# Patient Record
Sex: Male | Born: 1965 | Race: White | Hispanic: No | Marital: Married | Smoking: Former smoker
Health system: Southern US, Community
[De-identification: ages and names within clinical notes are randomized; demographics above are authoritative.]

## PROBLEM LIST (undated history)

## (undated) DIAGNOSIS — E785 Hyperlipidemia, unspecified: Secondary | ICD-10-CM

## (undated) DIAGNOSIS — S46011A Strain of muscle(s) and tendon(s) of the rotator cuff of right shoulder, initial encounter: Secondary | ICD-10-CM

## (undated) DIAGNOSIS — E119 Type 2 diabetes mellitus without complications: Secondary | ICD-10-CM

## (undated) DIAGNOSIS — G4730 Sleep apnea, unspecified: Secondary | ICD-10-CM

## (undated) HISTORY — DX: Hyperlipidemia, unspecified: E78.5

## (undated) HISTORY — PX: OTHER SURGICAL HISTORY: SHX169

## (undated) HISTORY — DX: Type 2 diabetes mellitus without complications: E11.9

---
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
## 2001-02-08 ENCOUNTER — Encounter: Payer: Self-pay | Admitting: Internal Medicine

## 2001-02-08 ENCOUNTER — Ambulatory Visit (HOSPITAL_COMMUNITY): Admission: RE | Admit: 2001-02-08 | Discharge: 2001-02-08 | Payer: Self-pay | Admitting: Internal Medicine

## 2003-12-21 ENCOUNTER — Other Ambulatory Visit: Admission: RE | Admit: 2003-12-21 | Discharge: 2003-12-21 | Payer: Self-pay | Admitting: Dermatology

## 2006-04-17 ENCOUNTER — Ambulatory Visit (HOSPITAL_COMMUNITY): Admission: RE | Admit: 2006-04-17 | Discharge: 2006-04-17 | Payer: Self-pay | Admitting: Family Medicine

## 2006-08-17 ENCOUNTER — Emergency Department (HOSPITAL_COMMUNITY): Admission: EM | Admit: 2006-08-17 | Discharge: 2006-08-17 | Payer: Self-pay | Admitting: Emergency Medicine

## 2007-08-07 ENCOUNTER — Ambulatory Visit (HOSPITAL_COMMUNITY): Admission: RE | Admit: 2007-08-07 | Discharge: 2007-08-07 | Payer: Self-pay | Admitting: Family Medicine

## 2008-04-22 ENCOUNTER — Ambulatory Visit (HOSPITAL_BASED_OUTPATIENT_CLINIC_OR_DEPARTMENT_OTHER): Admission: RE | Admit: 2008-04-22 | Discharge: 2008-04-22 | Payer: Self-pay | Admitting: Orthopedic Surgery

## 2008-05-26 ENCOUNTER — Encounter (HOSPITAL_COMMUNITY): Admission: RE | Admit: 2008-05-26 | Discharge: 2008-06-25 | Payer: Self-pay | Admitting: Orthopedic Surgery

## 2010-05-19 LAB — POCT I-STAT, CHEM 8
BUN: 13 mg/dL (ref 6–23)
Calcium, Ion: 1.22 mmol/L (ref 1.12–1.32)
Chloride: 106 mEq/L (ref 96–112)
Creatinine, Ser: 0.8 mg/dL (ref 0.4–1.5)
Glucose, Bld: 148 mg/dL — ABNORMAL HIGH (ref 70–99)
HCT: 49 % (ref 39.0–52.0)
Hemoglobin: 16.7 g/dL (ref 13.0–17.0)
Potassium: 3.7 mEq/L (ref 3.5–5.1)
Sodium: 143 mEq/L (ref 135–145)
TCO2: 26 mmol/L (ref 0–100)

## 2010-05-19 LAB — GLUCOSE, CAPILLARY: Glucose-Capillary: 138 mg/dL — ABNORMAL HIGH (ref 70–99)

## 2010-06-21 NOTE — Op Note (Signed)
NAME:  Preston Mack, Preston Mack NO.:  [id number]   MEDICAL RECORD NO.:  [id number]          PATIENT TYPE:  AMB   LOCATION:  DSC                          FACILITY:  [facility]   PHYSICIAN:  Robert A. Thurston Hole, M.D. DATE OF BIRTH:  16-Jun-1965   DATE OF PROCEDURE:  04/22/2008  DATE OF DISCHARGE:                               OPERATIVE REPORT   PREOPERATIVE DIAGNOSES:  1. Left triceps tendinitis with partial tear and olecranon spur.  2. Left elbow olecranon bursitis.   POSTOPERATIVE DIAGNOSES:  1. Left triceps tendinitis with partial tear and olecranon spur.  2. Left elbow olecranon bursitis.   PROCEDURES:  1. Left elbow examination under anesthesia followed by triceps      debridement with olecranon spur excision.  2. Left elbow olecranon bursectomy.   SURGEON:  Nilda Simmer, MD   ASSISTANT:  Julien Girt, PA   ANESTHESIA:  General.   OPERATIVE TIME:  45 minutes.   COMPLICATIONS:  None.   INDICATIONS FOR PROCEDURE:  Mr. Crisci is a 45 year old gentleman who has  had significant pain in his left elbow for over a year with exam, x-  rays, and MRI documenting a triceps tendinopathy, partial tearing and  olecranon spur as well as olecranon bursitis.  He has failed  conservative care and is now to undergo excision.   DESCRIPTION:  Mr. Rubi was brought to the operating room on April 22, 2008, after an axillary block was placed on him by Anesthesia.  He was  placed on the operative table in the supine position.  He received Ancef  1 g IV preoperatively for prophylaxis.  After being placed under general  anesthesia, his left elbow was examined.  He had full range of motion  and his elbow was stable to ligamentous exam and had left arm prepped  using sterile DuraPrep and draped using sterile technique.  The arm was  exsanguinated and the tourniquet elevated to 275 mm.  Initially, through  a 4-5 cm longitudinal incision based over the distal triceps tendon and  olecranon, initial exposure was made.  The underlying subcutaneous  tissues were incised along with the skin incision.  Olecranon bursal  tissue was found and thoroughly excised.  There was a small cystic  hematoma-type mass in this that was completely benign in appearance,  this was resected as well.  The underlying triceps tendon showed a  moderate tendinopathy and a longitudinal split was made in the  longitudinal tendon.  Ulnar nerve carefully protected during this entire  procedure.   Using intraoperative fluoroscopy, the olecranon spur was found and was  resected at the tip of the olecranon through the triceps tendon split.  Tendinopathy in the triceps tendon was then debrided, but only about 15-  20% of the triceps tendon was partially detached from the olecranon with  this debridement.  The intraoperative fluoroscopy confirmed complete  excision of the calcified spur.  At this point, the wound was irrigated  and then the triceps tendon closed, primarily tendon-to-tendon with  interrupted 2-0 Vicryl suture.  Subcutaneous tissue was closed with 2-0  Vicryl,  and subcuticular layer closed with 4-0 Monocryl.  Sterile  dressings and a long-arm splint were applied.  The patient then had the  tourniquet released.  He was then awakened, extubated, and taken to the  recovery room in stable condition.  Needle and sponge count was correct  x2 at the end of the case.   FOLLOWUP CARE:  Mr. Durrell will be followed either overnight for  observation due to his sleep apnea or discharged home depending on his  level of postoperative oxygenation.  He will be discharged on Percocet  and Robaxin.  Seen back in the office in a week for wound check and  followup.      Robert A. Thurston Hole, M.D.  Electronically Signed     RAW/MEDQ  D:  04/22/2008  T:  04/23/2008  Job:  [id number]

## 2010-11-22 LAB — BASIC METABOLIC PANEL
BUN: 12
CO2: 26
Calcium: 9
Chloride: 99
Creatinine, Ser: 0.53
GFR calc Af Amer: >60
GFR calc non Af Amer: >60
Glucose, Bld: 278 — ABNORMAL HIGH
Potassium: 3.8
Sodium: 133 — ABNORMAL LOW

## 2014-04-17 ENCOUNTER — Ambulatory Visit: Payer: Self-pay | Admitting: Skilled Nursing Facility1

## 2014-05-09 LAB — HM DIABETES EYE EXAM

## 2014-11-20 ENCOUNTER — Ambulatory Visit: Payer: Self-pay | Admitting: "Endocrinology

## 2014-11-27 LAB — HEMOGLOBIN A1C: Hgb A1c MFr Bld: 6.6 % — AB (ref 4.0–6.0)

## 2014-12-09 ENCOUNTER — Ambulatory Visit (INDEPENDENT_AMBULATORY_CARE_PROVIDER_SITE_OTHER): Payer: BC Managed Care – PPO | Admitting: "Endocrinology

## 2014-12-09 ENCOUNTER — Encounter: Payer: Self-pay | Admitting: "Endocrinology

## 2014-12-09 VITALS — BP 117/77 | HR 70 | Ht 61.0 in | Wt 262.0 lb

## 2014-12-09 DIAGNOSIS — IMO0002 Reserved for concepts with insufficient information to code with codable children: Secondary | ICD-10-CM

## 2014-12-09 DIAGNOSIS — E785 Hyperlipidemia, unspecified: Secondary | ICD-10-CM

## 2014-12-09 DIAGNOSIS — E6609 Other obesity due to excess calories: Secondary | ICD-10-CM

## 2014-12-09 DIAGNOSIS — E119 Type 2 diabetes mellitus without complications: Secondary | ICD-10-CM | POA: Insufficient documentation

## 2014-12-09 DIAGNOSIS — I10 Essential (primary) hypertension: Secondary | ICD-10-CM

## 2014-12-09 DIAGNOSIS — Z6834 Body mass index (BMI) 34.0-34.9, adult: Secondary | ICD-10-CM

## 2014-12-09 DIAGNOSIS — E782 Mixed hyperlipidemia: Secondary | ICD-10-CM | POA: Insufficient documentation

## 2014-12-09 DIAGNOSIS — E118 Type 2 diabetes mellitus with unspecified complications: Secondary | ICD-10-CM | POA: Diagnosis not present

## 2014-12-09 DIAGNOSIS — E1165 Type 2 diabetes mellitus with hyperglycemia: Secondary | ICD-10-CM

## 2014-12-09 MED ORDER — SIMVASTATIN 40 MG PO TABS: 40.0000 mg | ORAL_TABLET | Freq: Every day | ORAL | Status: DC

## 2014-12-09 MED ORDER — LISINOPRIL 20 MG PO TABS: 20.0000 mg | ORAL_TABLET | Freq: Every day | ORAL | Status: DC

## 2014-12-09 MED ORDER — SITAGLIPTIN PHOS-METFORMIN HCL 50-1000 MG PO TABS: 1.0000 | ORAL_TABLET | Freq: Two times a day (BID) | ORAL | Status: DC

## 2014-12-09 NOTE — Patient Instructions (Signed)

## 2014-12-09 NOTE — Progress Notes (Signed)
Subjective:    Patient ID: Preston Mack, male    DOB: 1965/05/21,    Past Medical History  Diagnosis Date  . Hyperlipidemia   . Diabetes mellitus, type II [facility])    Past Surgical History  Procedure Laterality Date  . Lipoma removal    . Osteophyte of bone     Social History   Social History  . Marital Status: Married    Spouse Name: N/A  . Number of Children: N/A  . Years of Education: N/A   Social History Main Topics  . Smoking status: Former Games developer  . Smokeless tobacco: None  . Alcohol Use: No  . Drug Use: No  . Sexual Activity: Not Asked   Other Topics Concern  . None   Social History Narrative  . None   Outpatient Encounter Prescriptions as of 12/09/2014  Medication Sig  . lisinopril (PRINIVIL,ZESTRIL) 20 MG tablet Take 1 tablet (20 mg total) by mouth daily.  . simvastatin (ZOCOR) 40 MG tablet Take 1 tablet (40 mg total) by mouth daily.  . sitaGLIPtin-metformin (JANUMET) 50-1000 MG tablet Take 1 tablet by mouth 2 (two) times daily with a meal.  . [DISCONTINUED] canagliflozin (INVOKANA) 100 MG TABS tablet Take 100 mg by mouth daily.  . [DISCONTINUED] lisinopril (PRINIVIL,ZESTRIL) 20 MG tablet Take 20 mg by mouth daily.  . [DISCONTINUED] simvastatin (ZOCOR) 40 MG tablet Take 40 mg by mouth daily.  . [DISCONTINUED] sitaGLIPtin-metformin (JANUMET) 50-1000 MG tablet Take 1 tablet by mouth 2 (two) times daily with a meal.   No facility-administered encounter medications on file as of 12/09/2014.   ALLERGIES: No Known Allergies VACCINATION STATUS:  There is no immunization history on file for this patient.  Diabetes He presents for his follow-up diabetic visit. He has type 2 diabetes mellitus. Onset time: He was diagnosed at approximate age of 37 years. His disease course has been improving. There are no hypoglycemic associated symptoms. Pertinent negatives for hypoglycemia include no confusion, headaches, pallor or seizures. There are no diabetic associated  symptoms. Pertinent negatives for diabetes include no chest pain, no fatigue, no polydipsia, no polyphagia, no polyuria and no weakness. There are no hypoglycemic complications. Symptoms are improving. There are no diabetic complications. Risk factors for coronary artery disease include diabetes mellitus, dyslipidemia, hypertension, family history, obesity, sedentary lifestyle and tobacco exposure. Current diabetic treatment includes oral agent (dual therapy). He is compliant with treatment most of the time. His weight is decreasing steadily. He is following a diabetic diet. He has had a previous visit with a dietitian. He participates in exercise intermittently. An ACE inhibitor/angiotensin II receptor blocker is being taken. Eye exam is current.  Hyperlipidemia This is a chronic problem. The current episode started more than 1 year ago. The problem is controlled. Exacerbating diseases include diabetes. Pertinent negatives include no chest pain, myalgias or shortness of breath. Current antihyperlipidemic treatment includes statins. Risk factors for coronary artery disease include diabetes mellitus, dyslipidemia, hypertension, male sex, a sedentary lifestyle and obesity.  Hypertension This is a chronic problem. The current episode started more than 1 year ago. Pertinent negatives include no chest pain, headaches, neck pain, palpitations or shortness of breath. Risk factors for coronary artery disease include dyslipidemia, diabetes mellitus and obesity. Past treatments include ACE inhibitors.     Review of Systems  Constitutional: Negative for fatigue and unexpected weight change.  HENT: Negative for dental problem, mouth sores and trouble swallowing.   Eyes: Negative for visual disturbance.  Respiratory: Negative for  cough, choking, chest tightness, shortness of breath and wheezing.   Cardiovascular: Negative for chest pain, palpitations and leg swelling.  Gastrointestinal: Negative for nausea,  vomiting, abdominal pain, diarrhea, constipation and abdominal distention.  Endocrine: Negative for polydipsia, polyphagia and polyuria.  Genitourinary: Negative for dysuria, urgency, hematuria and flank pain.  Musculoskeletal: Negative for myalgias, back pain, gait problem and neck pain.  Skin: Negative for pallor, rash and wound.  Neurological: Negative for seizures, syncope, weakness, numbness and headaches.  Psychiatric/Behavioral: Negative.  Negative for confusion and dysphoric mood.    Objective:    BP 117/77 mmHg  Pulse 70  Ht 5\' 1"  (1.549 m)  Wt 262 lb (118.842 kg)  BMI 49.53 kg/m2  SpO2 96%  Wt Readings from Last 3 Encounters:  12/09/14 262 lb (118.842 kg)    Physical Exam  Constitutional: He is oriented to person, place, and time. He appears well-developed and well-nourished. He is cooperative. No distress.  HENT:  Head: Normocephalic and atraumatic.  Eyes: EOM are normal.  Neck: Normal range of motion. Neck supple. No tracheal deviation present. No thyromegaly present.  Cardiovascular: Normal rate, S1 normal, S2 normal and normal heart sounds.  Exam reveals no gallop.   No murmur heard. Pulses:      Dorsalis pedis pulses are 1+ on the right side, and 1+ on the left side.       Posterior tibial pulses are 1+ on the right side, and 1+ on the left side.  Pulmonary/Chest: Breath sounds normal. No respiratory distress. He has no wheezes.  Abdominal: Soft. Bowel sounds are normal. He exhibits no distension. There is no tenderness. There is no guarding and no CVA tenderness.  Musculoskeletal: He exhibits no edema.       Right shoulder: He exhibits no swelling and no deformity.  Neurological: He is alert and oriented to person, place, and time. He has normal strength and normal reflexes. No cranial nerve deficit or sensory deficit. Gait normal.  Skin: Skin is warm and dry. No rash noted. No cyanosis. Nails show no clubbing.  Psychiatric: He has a normal mood and affect. His  speech is normal and behavior is normal. Judgment and thought content normal. Cognition and memory are normal.    Results for orders placed or performed in visit on 12/09/14  Hemoglobin A1c  Result Value Ref Range   Hgb A1c MFr Bld 6.6 (A) 4.0 - 6.0 %  HM DIABETES EYE EXAM  Result Value Ref Range   HM Diabetic Eye Exam No Retinopathy No Retinopathy   Complete Blood Count (Most recent): Lab Results  Component Value Date   HGB 16.7 04/22/2008   HCT 49.0 04/22/2008   Chemistry (most recent): Lab Results  Component Value Date   NA 143 04/22/2008   K 3.7 04/22/2008   CL 106 04/22/2008   CO2 26 08/17/2006   BUN 13 04/22/2008   CREATININE 0.8 04/22/2008   Diabetic Labs (most recent): Lab Results  Component Value Date   HGBA1C 6.6* 11/27/2014   Lipid profile (most recent): No results found for: TRIG, CHOL       Assessment & Plan:   1. Uncontrolled type 2 diabetes mellitus with complication, without long-term current use of insulin (HCC) - patient remains at a high risk for more acute and chronic complications of diabetes which include CAD, CVA, CKD, retinopathy, and neuropathy. These are all discussed in detail with the patient.  Patient came with better A1c of 6.6%, generally improving from 10.5%. He has lost  30 pounds overall.  Recent labs reviewed.   - I have re-counseled the patient on diet management and weight loss  by adopting a carbohydrate restricted / protein rich  Diet.  - Suggestion is made for patient to avoid simple carbohydrates   from their diet including Cakes , Desserts, Ice Cream,  Soda (  diet and regular) , Sweet Tea , Candies,  Chips, Cookies, Artificial Sweeteners,   and "Sugar-free" Products .  This will help patient to have stable blood glucose profile and potentially avoid unintended  Weight gain.  - Patient is advised to stick to a routine mealtimes to eat 3 meals  a day and avoid unnecessary snacks ( to snack only to correct hypoglycemia).  -  The patient  has been  scheduled with Norm Salt, RDN, CDE for individualized DM education.  - I have approached patient with the following individualized plan to manage diabetes and patient agrees.  -I will continue Janumet 50/1000mg  po BID. -I will discontinue his invokana. -he will be considered for GLP 1 inhibitors if he loses control.   - Patient specific target  for A1c; LDL, HDL, Triglycerides, and  Waist Circumference were discussed in detail.  2) BP/HTN: Controlled. Continue current medications including ACEI/ARB. 3) Lipids/HPL:  continue statins. 4)  Weight/Diet: CDE consult in progress, exercise, and carbohydrates information provided.  5) Chronic Care/Health Maintenance:  -Patient is on ACEI/ARB and Statin medications and encouraged to continue to follow up with Ophthalmology, Podiatrist at least yearly or according to recommendations, and advised to  stay away from smoking. I have recommended yearly flu vaccine and pneumonia vaccination at least every 5 years; moderate intensity exercise for up to 150 minutes weekly; and  sleep for at least 7 hours a day.  I advised patient to maintain close follow up with their PCP for primary care needs.  Patient is asked to bring meter and  blood glucose logs during their next visit.   Follow up plan: Return for diabetes, high blood pressure, high cholesterol.  Marquis Lunch, MD Phone: [phone]  Fax: [phone]   12/09/2014, 9:13 PM

## 2014-12-15 ENCOUNTER — Other Ambulatory Visit (HOSPITAL_COMMUNITY): Payer: Self-pay | Admitting: Family Medicine

## 2014-12-15 ENCOUNTER — Ambulatory Visit (HOSPITAL_COMMUNITY)
Admission: RE | Admit: 2014-12-15 | Discharge: 2014-12-15 | Disposition: A | Discharge status: Home / Self Care | Payer: BC Managed Care – PPO | Source: Ambulatory Visit | Attending: Family Medicine | Admitting: Family Medicine

## 2014-12-15 DIAGNOSIS — R600 Localized edema: Secondary | ICD-10-CM | POA: Insufficient documentation

## 2014-12-15 DIAGNOSIS — M7989 Other specified soft tissue disorders: Secondary | ICD-10-CM

## 2014-12-15 DIAGNOSIS — L03115 Cellulitis of right lower limb: Secondary | ICD-10-CM

## 2014-12-15 DIAGNOSIS — M79604 Pain in right leg: Secondary | ICD-10-CM | POA: Diagnosis not present

## 2015-02-12 ENCOUNTER — Other Ambulatory Visit: Payer: Self-pay

## 2015-02-12 MED ORDER — GLUCOSE BLOOD VI STRP: ORAL_STRIP | Status: DC

## 2015-03-12 ENCOUNTER — Ambulatory Visit: Payer: BC Managed Care – PPO | Admitting: "Endocrinology

## 2015-03-13 ENCOUNTER — Other Ambulatory Visit: Payer: Self-pay | Admitting: "Endocrinology

## 2015-03-13 LAB — BASIC METABOLIC PANEL
BUN: 20 mg/dL (ref 7–25)
CHLORIDE: 107 mmol/L (ref 98–110)
CO2: 29 mmol/L (ref 20–31)
Calcium: 9.2 mg/dL (ref 8.6–10.3)
Creat: 0.82 mg/dL (ref 0.60–1.35)
Glucose, Bld: 194 mg/dL — ABNORMAL HIGH (ref 65–99)
Potassium: 4.9 mmol/L (ref 3.5–5.3)
Sodium: 140 mmol/L (ref 135–146)

## 2015-03-13 LAB — HEMOGLOBIN A1C
HEMOGLOBIN A1C: 8.4 % — AB (ref <5.7)
MEAN PLASMA GLUCOSE: 194 mg/dL — AB (ref <117)

## 2015-03-19 ENCOUNTER — Ambulatory Visit (INDEPENDENT_AMBULATORY_CARE_PROVIDER_SITE_OTHER): Payer: BC Managed Care – PPO | Admitting: "Endocrinology

## 2015-03-19 ENCOUNTER — Encounter: Payer: Self-pay | Admitting: "Endocrinology

## 2015-03-19 VITALS — BP 114/80 | HR 76 | Ht 74.0 in | Wt 279.0 lb

## 2015-03-19 DIAGNOSIS — E6609 Other obesity due to excess calories: Secondary | ICD-10-CM

## 2015-03-19 DIAGNOSIS — E1165 Type 2 diabetes mellitus with hyperglycemia: Secondary | ICD-10-CM | POA: Diagnosis not present

## 2015-03-19 DIAGNOSIS — E118 Type 2 diabetes mellitus with unspecified complications: Secondary | ICD-10-CM | POA: Diagnosis not present

## 2015-03-19 DIAGNOSIS — E785 Hyperlipidemia, unspecified: Secondary | ICD-10-CM

## 2015-03-19 DIAGNOSIS — IMO0002 Reserved for concepts with insufficient information to code with codable children: Secondary | ICD-10-CM

## 2015-03-19 DIAGNOSIS — I10 Essential (primary) hypertension: Secondary | ICD-10-CM

## 2015-03-19 MED ORDER — CANAGLIFLOZIN 100 MG PO TABS: 100.0000 mg | ORAL_TABLET | Freq: Every day | ORAL | Status: DC

## 2015-03-19 NOTE — Progress Notes (Signed)
Subjective:    Patient ID: Preston Mack, male    DOB: 07/20/1965,    Past Medical History  Diagnosis Date  . Hyperlipidemia   . Diabetes mellitus, type II [facility])    Past Surgical History  Procedure Laterality Date  . Lipoma removal    . Osteophyte of bone     Social History   Social History  . Marital Status: Married    Spouse Name: N/A  . Number of Children: N/A  . Years of Education: N/A   Social History Main Topics  . Smoking status: Former Games developer  . Smokeless tobacco: None  . Alcohol Use: No  . Drug Use: No  . Sexual Activity: Not Asked   Other Topics Concern  . None   Social History Narrative   Outpatient Encounter Prescriptions as of 03/19/2015  Medication Sig  . canagliflozin (INVOKANA) 100 MG TABS tablet Take 1 tablet (100 mg total) by mouth daily before breakfast.  . glucose blood test strip Use as instructed tid  Dx: E11.65.  Marland Kitchen lisinopril (PRINIVIL,ZESTRIL) 20 MG tablet Take 1 tablet (20 mg total) by mouth daily.  . simvastatin (ZOCOR) 40 MG tablet Take 1 tablet (40 mg total) by mouth daily.  . sitaGLIPtin-metformin (JANUMET) 50-1000 MG tablet Take 1 tablet by mouth 2 (two) times daily with a meal.   No facility-administered encounter medications on file as of 03/19/2015.   ALLERGIES: No Known Allergies VACCINATION STATUS:  There is no immunization history on file for this patient.  Diabetes He presents for his follow-up diabetic visit. He has type 2 diabetes mellitus. Onset time: He was diagnosed at approximate age of 37 years. 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 This caused him to have a course of uncontrolled glycemia and raised is A1c to 8.4%.). There are no hypoglycemic associated symptoms. Pertinent negatives for hypoglycemia include no confusion, headaches, pallor or seizures. There are no diabetic associated symptoms.  Pertinent negatives for diabetes include no chest pain, no fatigue, no polydipsia, no polyphagia, no polyuria and no weakness. There are no hypoglycemic complications. Symptoms are worsening. There are no diabetic complications. Risk factors for coronary artery disease include diabetes mellitus, dyslipidemia, hypertension, family history, obesity, sedentary lifestyle and tobacco exposure. Current diabetic treatment includes oral agent (dual therapy). He is compliant with treatment most of the time. His weight is increasing steadily. He is following a diabetic diet. He has had a previous visit with a dietitian. He participates in exercise intermittently. His overall blood glucose range is 180-200 mg/dl. An ACE inhibitor/angiotensin II receptor blocker is being taken. Eye exam is current.  Hyperlipidemia This is a chronic problem. The current episode started more than 1 year ago. The problem is controlled. Exacerbating diseases include diabetes. Pertinent negatives include no chest pain, myalgias or shortness of breath. Current antihyperlipidemic treatment includes statins. Risk factors for coronary artery disease include diabetes mellitus, dyslipidemia, hypertension, male sex, a sedentary lifestyle and obesity.  Hypertension This is a chronic problem. The current episode started more than 1 year ago. Pertinent negatives include no chest pain, headaches, neck pain, palpitations or shortness of breath. Risk factors for coronary artery disease include dyslipidemia, diabetes mellitus and obesity. Past treatments include ACE inhibitors.     Review of Systems  Constitutional: Negative for fatigue and unexpected weight change.  HENT: Negative for dental problem, mouth sores and trouble swallowing.  Eyes: Negative for visual disturbance.  Respiratory: Negative for cough, choking, chest tightness, shortness of breath and wheezing.   Cardiovascular: Negative for chest pain, palpitations and leg swelling.   Gastrointestinal: Negative for nausea, vomiting, abdominal pain, diarrhea, constipation and abdominal distention.  Endocrine: Negative for polydipsia, polyphagia and polyuria.  Genitourinary: Negative for dysuria, urgency, hematuria and flank pain.  Musculoskeletal: Negative for myalgias, back pain, gait problem and neck pain.  Skin: Negative for pallor, rash and wound.  Neurological: Negative for seizures, syncope, weakness, numbness and headaches.  Psychiatric/Behavioral: Negative.  Negative for confusion and dysphoric mood.    Objective:    BP 114/80 mmHg  Pulse 76  Ht  (1.88 m)  Wt 279 lb (126.554 kg)  BMI 35.81 kg/m2  SpO2 97%  Wt Readings from Last 3 Encounters:  03/19/15 279 lb (126.554 kg)  12/09/14 262 lb (118.842 kg)    Physical Exam  Constitutional: He is oriented to person, place, and time. He appears well-developed and well-nourished. He is cooperative. No distress.  HENT:  Head: Normocephalic and atraumatic.  Eyes: EOM are normal.  Neck: Normal range of motion. Neck supple. No tracheal deviation present. No thyromegaly present.  Cardiovascular: Normal rate, S1 normal, S2 normal and normal heart sounds.  Exam reveals no gallop.   No murmur heard. Pulses:      Dorsalis pedis pulses are 1+ on the right side, and 1+ on the left side.       Posterior tibial pulses are 1+ on the right side, and 1+ on the left side.  Pulmonary/Chest: Breath sounds normal. No respiratory distress. He has no wheezes.  Abdominal: Soft. Bowel sounds are normal. He exhibits no distension. There is no tenderness. There is no guarding and no CVA tenderness.  Musculoskeletal: He exhibits no edema.       Right shoulder: He exhibits no swelling and no deformity.  Neurological: He is alert and oriented to person, place, and time. He has normal strength and normal reflexes. No cranial nerve deficit or sensory deficit. Gait normal.  Skin: Skin is warm and dry. No rash noted. No cyanosis. Nails  show no clubbing.  Psychiatric: He has a normal mood and affect. His speech is normal and behavior is normal. Judgment and thought content normal. Cognition and memory are normal.    Results for orders placed or performed in visit on 03/13/15  Basic metabolic panel  Result Value Ref Range   Sodium 140 135 - 146 mmol/L   Potassium 4.9 3.5 - 5.3 mmol/L   Chloride 107 98 - 110 mmol/L   CO2 29 20 - 31 mmol/L   Glucose, Bld 194 (H) 65 - 99 mg/dL   BUN 20 7 - 25 mg/dL   Creat 4.09 8.11 - 9.14 mg/dL   Calcium 9.2 8.6 - 78.2 mg/dL  Hemoglobin N5A  Result Value Ref Range   Hgb A1c MFr Bld 8.4 (H) <5.7 %   Mean Plasma Glucose 194 (H) <117 mg/dL   Complete Blood Count (Most recent): Lab Results  Component Value Date   HGB 16.7 04/22/2008   HCT 49.0 04/22/2008   Chemistry (most recent): Lab Results  Component Value Date   NA 140 03/13/2015   K 4.9 03/13/2015   CL 107 03/13/2015   CO2 29 03/13/2015   BUN 20 03/13/2015   CREATININE 0.82 03/13/2015   Diabetic Labs (most recent): Lab Results  Component Value Date   HGBA1C 8.4* 03/13/2015   HGBA1C 6.6* 11/27/2014  Assessment & Plan:   1. Uncontrolled type 2 diabetes mellitus with complication, without long-term current use of insulin (HCC) - patient remains at a high risk for more acute and chronic complications of diabetes which include CAD, CVA, CKD, retinopathy, and neuropathy. These are all discussed in detail with the patient.  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   Recent labs reviewed.   - I have re-counseled the patient on diet management and weight loss  by adopting a carbohydrate restricted / protein rich  Diet.  - Suggestion is made for patient to avoid simple carbohydrates   from their diet including Cakes , Desserts, Ice Cream,  Soda (  diet and regular) , Sweet Tea , Candies,  Chips, Cookies, Artificial  Sweeteners,   and "Sugar-free" Products .  This will help patient to have stable blood glucose profile and potentially avoid unintended  Weight gain.  - Patient is advised to stick to a routine mealtimes to eat 3 meals  a day and avoid unnecessary snacks ( to snack only to correct hypoglycemia).  - The patient  has been  scheduled with Norm Salt, RDN, CDE for individualized DM education.  - I have approached patient with the following individualized plan to manage diabetes and patient agrees.  -I will continue Janumet 50/1000mg  po BID. -I will resume Invega, 100 mg by mouth every morning. -he will be considered for GLP 1 inhibitors if he loses control.   - Patient specific target  for A1c; LDL, HDL, Triglycerides, and  Waist Circumference were discussed in detail.  2) BP/HTN: Controlled. Continue current medications including ACEI/ARB. 3) Lipids/HPL:  continue statins. 4)  Weight/Diet: CDE consult in progress, exercise, and carbohydrates information provided.  5) Chronic Care/Health Maintenance:  -Patient is on ACEI/ARB and Statin medications and encouraged to continue to follow up with Ophthalmology, Podiatrist at least yearly or according to recommendations, and advised to  stay away from smoking. I have recommended yearly flu vaccine and pneumonia vaccination at least every 5 years; moderate intensity exercise for up to 150 minutes weekly; and  sleep for at least 7 hours a day.  I advised patient to maintain close follow up with their PCP for primary care needs.  Patient is asked to bring meter and  blood glucose logs during their next visit.   Follow up plan: Return in about 3 months (around 06/16/2015) for diabetes, high blood pressure, high cholesterol, follow up with meter and logs- no labs.  Marquis Lunch, MD Phone: [phone]  Fax: [phone]   03/19/2015, 4:10 PM

## 2015-03-19 NOTE — Patient Instructions (Signed)

## 2015-04-16 ENCOUNTER — Telehealth: Payer: Self-pay | Admitting: "Endocrinology

## 2015-04-16 NOTE — Telephone Encounter (Signed)
[employer] won't pay for his invocana - please call in a new prescription

## 2015-04-19 ENCOUNTER — Other Ambulatory Visit: Payer: Self-pay | Admitting: "Endocrinology

## 2015-04-19 MED ORDER — EMPAGLIFLOZIN 10 MG PO TABS: 10.0000 mg | ORAL_TABLET | Freq: Every day | ORAL | Status: DC

## 2015-04-19 NOTE — Telephone Encounter (Signed)
I will send in Jardiance for him.

## 2015-06-19 ENCOUNTER — Other Ambulatory Visit: Payer: Self-pay | Admitting: "Endocrinology

## 2015-06-19 LAB — HEMOGLOBIN A1C
Hgb A1c MFr Bld: 7.1 % — ABNORMAL HIGH (ref <5.7)
Mean Plasma Glucose: 157 mg/dL

## 2015-06-19 LAB — BASIC METABOLIC PANEL
BUN: 20 mg/dL (ref 7–25)
CHLORIDE: 106 mmol/L (ref 98–110)
CO2: 26 mmol/L (ref 20–31)
CREATININE: 0.81 mg/dL (ref 0.60–1.35)
Calcium: 9.4 mg/dL (ref 8.6–10.3)
GLUCOSE: 132 mg/dL — AB (ref 65–99)
POTASSIUM: 4.2 mmol/L (ref 3.5–5.3)
Sodium: 140 mmol/L (ref 135–146)

## 2015-06-19 LAB — T4, FREE: FREE T4: 1.2 ng/dL (ref 0.8–1.8)

## 2015-06-19 LAB — TSH: TSH: 0.63 mIU/L (ref 0.40–4.50)

## 2015-06-21 ENCOUNTER — Ambulatory Visit: Payer: BC Managed Care – PPO | Admitting: "Endocrinology

## 2015-06-28 ENCOUNTER — Ambulatory Visit (INDEPENDENT_AMBULATORY_CARE_PROVIDER_SITE_OTHER): Payer: BC Managed Care – PPO | Admitting: "Endocrinology

## 2015-06-28 ENCOUNTER — Encounter: Payer: Self-pay | Admitting: "Endocrinology

## 2015-06-28 VITALS — BP 119/64 | HR 65 | Ht 74.0 in | Wt 278.0 lb

## 2015-06-28 DIAGNOSIS — E1165 Type 2 diabetes mellitus with hyperglycemia: Secondary | ICD-10-CM

## 2015-06-28 DIAGNOSIS — E6609 Other obesity due to excess calories: Secondary | ICD-10-CM | POA: Diagnosis not present

## 2015-06-28 DIAGNOSIS — E785 Hyperlipidemia, unspecified: Secondary | ICD-10-CM | POA: Diagnosis not present

## 2015-06-28 DIAGNOSIS — E118 Type 2 diabetes mellitus with unspecified complications: Secondary | ICD-10-CM

## 2015-06-28 DIAGNOSIS — IMO0002 Reserved for concepts with insufficient information to code with codable children: Secondary | ICD-10-CM

## 2015-06-28 DIAGNOSIS — I10 Essential (primary) hypertension: Secondary | ICD-10-CM | POA: Diagnosis not present

## 2015-06-28 MED ORDER — SITAGLIPTIN PHOS-METFORMIN HCL 50-1000 MG PO TABS: 1.0000 | ORAL_TABLET | Freq: Two times a day (BID) | ORAL | Status: DC

## 2015-06-28 MED ORDER — EMPAGLIFLOZIN 10 MG PO TABS: 10.0000 mg | ORAL_TABLET | Freq: Every day | ORAL | Status: DC

## 2015-06-28 NOTE — Progress Notes (Signed)
Subjective:    Patient ID: Preston Mack, male    DOB: 01/08/1966,    Past Medical History  Diagnosis Date  . Hyperlipidemia   . Diabetes mellitus, type II [facility])    Past Surgical History  Procedure Laterality Date  . Lipoma removal    . Osteophyte of bone     Social History   Social History  . Marital Status: Married    Spouse Name: N/A  . Number of Children: N/A  . Years of Education: N/A   Social History Main Topics  . Smoking status: Former Games developer  . Smokeless tobacco: None  . Alcohol Use: No  . Drug Use: No  . Sexual Activity: Not Asked   Other Topics Concern  . None   Social History Narrative   Outpatient Encounter Prescriptions as of 06/28/2015  Medication Sig  . empagliflozin (JARDIANCE) 10 MG TABS tablet Take 10 mg by mouth daily.  Marland Kitchen glucose blood test strip Use as instructed tid  Dx: E11.65.  Marland Kitchen lisinopril (PRINIVIL,ZESTRIL) 20 MG tablet Take 1 tablet (20 mg total) by mouth daily.  . simvastatin (ZOCOR) 40 MG tablet Take 1 tablet (40 mg total) by mouth daily.  . sitaGLIPtin-metformin (JANUMET) 50-1000 MG tablet Take 1 tablet by mouth 2 (two) times daily with a meal.  . [DISCONTINUED] empagliflozin (JARDIANCE) 10 MG TABS tablet Take 10 mg by mouth daily.  . [DISCONTINUED] sitaGLIPtin-metformin (JANUMET) 50-1000 MG tablet Take 1 tablet by mouth 2 (two) times daily with a meal.   No facility-administered encounter medications on file as of 06/28/2015.   ALLERGIES: No Known Allergies VACCINATION STATUS:  There is no immunization history on file for this patient.  Diabetes He presents for his follow-up diabetic visit. He has type 2 diabetes mellitus. Onset time: He was diagnosed at approximate age of 37 years. His disease course has been improving (Since his last visit he developed viral craniopathy  for which he received high-dose steroids in [facility] systems. This caused him to have a course of uncontrolled glycemia and raised is A1c to 8.4%.).  There are no hypoglycemic associated symptoms. Pertinent negatives for hypoglycemia include no confusion, headaches, pallor or seizures. There are no diabetic associated symptoms. Pertinent negatives for diabetes include no chest pain, no fatigue, no polydipsia, no polyphagia, no polyuria and no weakness. There are no hypoglycemic complications. Symptoms are improving. There are no diabetic complications. Risk factors for coronary artery disease include diabetes mellitus, dyslipidemia, hypertension, family history, obesity, sedentary lifestyle and tobacco exposure. Current diabetic treatment includes oral agent (dual therapy). He is compliant with treatment most of the time. His weight is stable. He is following a diabetic diet. He has had a previous visit with a dietitian. He participates in exercise intermittently. An ACE inhibitor/angiotensin II receptor blocker is being taken. Eye exam is current.  Hyperlipidemia This is a chronic problem. The current episode started more than 1 year ago. The problem is controlled. Exacerbating diseases include diabetes. Pertinent negatives include no chest pain, myalgias or shortness of breath. Current antihyperlipidemic treatment includes statins. Risk factors for coronary artery disease include diabetes mellitus, dyslipidemia, hypertension, male sex, a sedentary lifestyle and obesity.  Hypertension This is a chronic problem. The current episode started more than 1 year ago. Pertinent negatives include no chest pain, headaches, neck pain, palpitations or shortness of breath. Risk factors for coronary artery disease include dyslipidemia, diabetes mellitus and obesity. Past treatments include ACE inhibitors.     Review of Systems  Constitutional: Negative for fatigue and unexpected weight change.  HENT: Negative for dental problem, mouth sores and trouble swallowing.   Eyes: Negative for visual disturbance.  Respiratory: Negative for cough, choking, chest tightness,  shortness of breath and wheezing.   Cardiovascular: Negative for chest pain, palpitations and leg swelling.  Gastrointestinal: Negative for nausea, vomiting, abdominal pain, diarrhea, constipation and abdominal distention.  Endocrine: Negative for polydipsia, polyphagia and polyuria.  Genitourinary: Negative for dysuria, urgency, hematuria and flank pain.  Musculoskeletal: Negative for myalgias, back pain, gait problem and neck pain.  Skin: Negative for pallor, rash and wound.  Neurological: Negative for seizures, syncope, weakness, numbness and headaches.  Psychiatric/Behavioral: Negative.  Negative for confusion and dysphoric mood.    Objective:    BP 119/64 mmHg  Pulse 65  Ht 6\' 2"  (1.88 m)  Wt 278 lb (126.1 kg)  BMI 35.68 kg/m2  SpO2 97%  Wt Readings from Last 3 Encounters:  06/28/15 278 lb (126.1 kg)  03/19/15 279 lb (126.554 kg)  12/09/14 262 lb (118.842 kg)    Physical Exam  Constitutional: He is oriented to person, place, and time. He appears well-developed and well-nourished. He is cooperative. No distress.  HENT:  Head: Normocephalic and atraumatic.  Eyes: EOM are normal.  Neck: Normal range of motion. Neck supple. No tracheal deviation present. No thyromegaly present.  Cardiovascular: Normal rate, S1 normal, S2 normal and normal heart sounds.  Exam reveals no gallop.   No murmur heard. Pulses:      Dorsalis pedis pulses are 1+ on the right side, and 1+ on the left side.       Posterior tibial pulses are 1+ on the right side, and 1+ on the left side.  Pulmonary/Chest: Breath sounds normal. No respiratory distress. He has no wheezes.  Abdominal: Soft. Bowel sounds are normal. He exhibits no distension. There is no tenderness. There is no guarding and no CVA tenderness.  Musculoskeletal: He exhibits no edema.       Right shoulder: He exhibits no swelling and no deformity.  Neurological: He is alert and oriented to person, place, and time. He has normal strength and  normal reflexes. No cranial nerve deficit or sensory deficit. Gait normal.  Skin: Skin is warm and dry. No rash noted. No cyanosis. Nails show no clubbing.  Psychiatric: He has a normal mood and affect. His speech is normal and behavior is normal. Judgment and thought content normal. Cognition and memory are normal.    Results for orders placed or performed in visit on 06/19/15  Basic metabolic panel  Result Value Ref Range   Sodium 140 135 - 146 mmol/L   Potassium 4.2 3.5 - 5.3 mmol/L   Chloride 106 98 - 110 mmol/L   CO2 26 20 - 31 mmol/L   Glucose, Bld 132 (H) 65 - 99 mg/dL   BUN 20 7 - 25 mg/dL   Creat 1.610.81 0.960.60 - 0.451.35 mg/dL   Calcium 9.4 8.6 - 40.910.3 mg/dL  TSH  Result Value Ref Range   TSH 0.63 0.40 - 4.50 mIU/L  T4, free  Result Value Ref Range   Free T4 1.2 0.8 - 1.8 ng/dL  Hemoglobin W1XA1c  Result Value Ref Range   Hgb A1c MFr Bld 7.1 (H) <5.7 %   Mean Plasma Glucose 157 mg/dL   Complete Blood Count (Most recent): Lab Results  Component Value Date   HGB 16.7 04/22/2008   HCT 49.0 04/22/2008   Chemistry (most recent): Lab Results  Component  Value Date   NA 140 06/19/2015   K 4.2 06/19/2015   CL 106 06/19/2015   CO2 26 06/19/2015   BUN 20 06/19/2015   CREATININE 0.81 06/19/2015   Diabetic Labs (most recent): Lab Results  Component Value Date   HGBA1C 7.1* 06/19/2015   HGBA1C 8.4* 03/13/2015   HGBA1C 6.6* 11/27/2014      Assessment & Plan:   1. Uncontrolled type 2 diabetes mellitus with complication, without long-term current use of insulin (HCC) - patient remains at a high risk for more acute and chronic complications of diabetes which include CAD, CVA, CKD, retinopathy, and neuropathy. These are all discussed in detail with the patient.  Patient came with  Improved A1c of 7.1% from  8.4% .  Recent labs reviewed.   - I have re-counseled the patient on diet management and weight loss  by adopting a carbohydrate restricted / protein rich  Diet.  -  Suggestion is made for patient to avoid simple carbohydrates   from their diet including Cakes , Desserts, Ice Cream,  Soda (  diet and regular) , Sweet Tea , Candies,  Chips, Cookies, Artificial Sweeteners,   and "Sugar-free" Products .  This will help patient to have stable blood glucose profile and potentially avoid unintended  Weight gain.  - Patient is advised to stick to a routine mealtimes to eat 3 meals  a day and avoid unnecessary snacks ( to snack only to correct hypoglycemia).  - The patient  has been  scheduled with Norm Salt, RDN, CDE for individualized DM education.  - I have approached patient with the following individualized plan to manage diabetes and patient agrees.  -I will continue Janumet 50/1000mg  po BID. -I will continue jardiance 10 mg by mouth every morning. -he will be considered for GLP 1 inhibitors if he loses control.   - Patient specific target  for A1c; LDL, HDL, Triglycerides, and  Waist Circumference were discussed in detail.  2) BP/HTN: Controlled. Continue current medications including ACEI/ARB. 3) Lipids/HPL:  continue statins. 4)  Weight/Diet: CDE consult in progress, exercise, and carbohydrates information provided.  5) Chronic Care/Health Maintenance:  -Patient is on ACEI/ARB and Statin medications and encouraged to continue to follow up with Ophthalmology, Podiatrist at least yearly or according to recommendations, and advised to  stay away from smoking. I have recommended yearly flu vaccine and pneumonia vaccination at least every 5 years; moderate intensity exercise for up to 150 minutes weekly; and  sleep for at least 7 hours a day.  I advised patient to maintain close follow up with their PCP for primary care needs.  Patient is asked to bring meter and  blood glucose logs during their next visit.   Follow up plan: Return in about 3 months (around 09/28/2015) for diabetes, high blood pressure, high cholesterol.  Marquis Lunch, MD Phone:  [phone]  Fax: [phone]   06/28/2015, 4:31 PM

## 2015-06-28 NOTE — Patient Instructions (Signed)

## 2015-09-30 ENCOUNTER — Other Ambulatory Visit: Payer: Self-pay | Admitting: "Endocrinology

## 2015-10-15 ENCOUNTER — Ambulatory Visit: Payer: BC Managed Care – PPO | Admitting: "Endocrinology

## 2015-10-19 ENCOUNTER — Other Ambulatory Visit: Payer: Self-pay | Admitting: "Endocrinology

## 2015-10-19 LAB — COMPLETE METABOLIC PANEL WITH GFR
ALBUMIN: 4.2 g/dL (ref 3.6–5.1)
ALK PHOS: 35 U/L — AB (ref 40–115)
ALT: 20 U/L (ref 9–46)
AST: 16 U/L (ref 10–40)
BILIRUBIN TOTAL: 0.5 mg/dL (ref 0.2–1.2)
BUN: 17 mg/dL (ref 7–25)
CO2: 25 mmol/L (ref 20–31)
CREATININE: 0.74 mg/dL (ref 0.60–1.35)
Calcium: 9.5 mg/dL (ref 8.6–10.3)
Chloride: 105 mmol/L (ref 98–110)
GFR, Est Non African American: >89 mL/min (ref >=60)
GLUCOSE: 159 mg/dL — AB (ref 65–99)
Potassium: 4.2 mmol/L (ref 3.5–5.3)
Sodium: 141 mmol/L (ref 135–146)
TOTAL PROTEIN: 6.2 g/dL (ref 6.1–8.1)

## 2015-10-20 LAB — HEMOGLOBIN A1C
Hgb A1c MFr Bld: 7.4 % — ABNORMAL HIGH (ref <5.7)
Mean Plasma Glucose: 166 mg/dL

## 2015-10-25 ENCOUNTER — Ambulatory Visit (INDEPENDENT_AMBULATORY_CARE_PROVIDER_SITE_OTHER): Payer: BC Managed Care – PPO | Admitting: "Endocrinology

## 2015-10-25 ENCOUNTER — Encounter: Payer: Self-pay | Admitting: "Endocrinology

## 2015-10-25 VITALS — BP 138/72 | HR 81 | Ht 74.0 in | Wt 280.0 lb

## 2015-10-25 DIAGNOSIS — E1165 Type 2 diabetes mellitus with hyperglycemia: Secondary | ICD-10-CM

## 2015-10-25 DIAGNOSIS — E6609 Other obesity due to excess calories: Secondary | ICD-10-CM | POA: Diagnosis not present

## 2015-10-25 DIAGNOSIS — E785 Hyperlipidemia, unspecified: Secondary | ICD-10-CM

## 2015-10-25 DIAGNOSIS — IMO0002 Reserved for concepts with insufficient information to code with codable children: Secondary | ICD-10-CM

## 2015-10-25 DIAGNOSIS — E118 Type 2 diabetes mellitus with unspecified complications: Secondary | ICD-10-CM

## 2015-10-25 DIAGNOSIS — I10 Essential (primary) hypertension: Secondary | ICD-10-CM

## 2015-10-25 NOTE — Progress Notes (Signed)
Subjective:    Patient ID: Preston Mack, male    DOB: 1965-02-08,    Past Medical History:  Diagnosis Date  . Diabetes mellitus, type II (HCC)   . Hyperlipidemia    Past Surgical History:  Procedure Laterality Date  . Lipoma removal    . Osteophyte of bone     Social History   Social History  . Marital status: Married    Spouse name: N/A  . Number of children: N/A  . Years of education: N/A   Social History Main Topics  . Smoking status: Former Games developer  . Smokeless tobacco: Never Used  . Alcohol use No  . Drug use: No  . Sexual activity: Not Asked   Other Topics Concern  . None   Social History Narrative  . None   Outpatient Encounter Prescriptions as of 10/25/2015  Medication Sig  . empagliflozin (JARDIANCE) 10 MG TABS tablet Take 10 mg by mouth daily.  Marland Kitchen glucose blood test strip Use as instructed tid  Dx: E11.65.  Marland Kitchen JANUMET 50-1000 MG tablet TAKE 1 TABLET TWICE DAILY WITH MEALS  . lisinopril (PRINIVIL,ZESTRIL) 20 MG tablet Take 1 tablet (20 mg total) by mouth daily.  . simvastatin (ZOCOR) 40 MG tablet Take 1 tablet (40 mg total) by mouth daily.   No facility-administered encounter medications on file as of 10/25/2015.    ALLERGIES: No Known Allergies VACCINATION STATUS:  There is no immunization history on file for this patient.  Diabetes  He presents for his follow-up diabetic visit. He has type 2 diabetes mellitus. Onset time: He was diagnosed at approximate age of 37 years. His disease course has been stable (Since his last visit he developed viral craniopathy  for which he received high-dose steroids in [facility] systems. This caused him to have a course of uncontrolled glycemia and raised is A1c to 8.4%.). There are no hypoglycemic associated symptoms. Pertinent negatives for hypoglycemia include no confusion, headaches, pallor or seizures. There are no diabetic associated symptoms. Pertinent negatives for diabetes include no chest pain, no fatigue,  no polydipsia, no polyphagia, no polyuria and no weakness. There are no hypoglycemic complications. Symptoms are stable. There are no diabetic complications. Risk factors for coronary artery disease include diabetes mellitus, dyslipidemia, hypertension, family history, obesity, sedentary lifestyle and tobacco exposure. Current diabetic treatment includes oral agent (dual therapy). He is compliant with treatment most of the time. His weight is stable. He is following a diabetic diet. He has had a previous visit with a dietitian. He participates in exercise intermittently. An ACE inhibitor/angiotensin II receptor blocker is being taken. Eye exam is current.  Hyperlipidemia  This is a chronic problem. The current episode started more than 1 year ago. The problem is controlled. Exacerbating diseases include diabetes. Pertinent negatives include no chest pain, myalgias or shortness of breath. Current antihyperlipidemic treatment includes statins. Risk factors for coronary artery disease include diabetes mellitus, dyslipidemia, hypertension, male sex, a sedentary lifestyle and obesity.  Hypertension  This is a chronic problem. The current episode started more than 1 year ago. Pertinent negatives include no chest pain, headaches, neck pain, palpitations or shortness of breath. Risk factors for coronary artery disease include dyslipidemia, diabetes mellitus and obesity. Past treatments include ACE inhibitors.     Review of Systems  Constitutional: Negative for fatigue and unexpected weight change.  HENT: Negative for dental problem, mouth sores and trouble swallowing.   Eyes: Negative for visual disturbance.  Respiratory: Negative for cough, choking, chest  tightness, shortness of breath and wheezing.   Cardiovascular: Negative for chest pain, palpitations and leg swelling.  Gastrointestinal: Negative for abdominal distention, abdominal pain, constipation, diarrhea, nausea and vomiting.  Endocrine: Negative  for polydipsia, polyphagia and polyuria.  Genitourinary: Negative for dysuria, flank pain, hematuria and urgency.  Musculoskeletal: Negative for back pain, gait problem, myalgias and neck pain.  Skin: Negative for pallor, rash and wound.  Neurological: Negative for seizures, syncope, weakness, numbness and headaches.  Psychiatric/Behavioral: Negative.  Negative for confusion and dysphoric mood.    Objective:    BP 138/72   Pulse 81   Ht 6\' 2"  (1.88 m)   Wt 280 lb (127 kg)   BMI 35.95 kg/m   Wt Readings from Last 3 Encounters:  10/25/15 280 lb (127 kg)  06/28/15 278 lb (126.1 kg)  03/19/15 279 lb (126.6 kg)    Physical Exam  Constitutional: He is oriented to person, place, and time. He appears well-developed and well-nourished. He is cooperative. No distress.  HENT:  Head: Normocephalic and atraumatic.  Eyes: EOM are normal.  Neck: Normal range of motion. Neck supple. No tracheal deviation present. No thyromegaly present.  Cardiovascular: Normal rate, S1 normal, S2 normal and normal heart sounds.  Exam reveals no gallop.   No murmur heard. Pulses:      Dorsalis pedis pulses are 1+ on the right side, and 1+ on the left side.       Posterior tibial pulses are 1+ on the right side, and 1+ on the left side.  Pulmonary/Chest: Breath sounds normal. No respiratory distress. He has no wheezes.  Abdominal: Soft. Bowel sounds are normal. He exhibits no distension. There is no tenderness. There is no guarding and no CVA tenderness.  Musculoskeletal: He exhibits no edema.       Right shoulder: He exhibits no swelling and no deformity.  Neurological: He is alert and oriented to person, place, and time. He has normal strength and normal reflexes. No cranial nerve deficit or sensory deficit. Gait normal.  Skin: Skin is warm and dry. No rash noted. No cyanosis. Nails show no clubbing.  Psychiatric: He has a normal mood and affect. His speech is normal and behavior is normal. Judgment and thought  content normal. Cognition and memory are normal.    Results for orders placed or performed in visit on 10/19/15  COMPLETE METABOLIC PANEL WITH GFR  Result Value Ref Range   Sodium 141 135 - 146 mmol/L   Potassium 4.2 3.5 - 5.3 mmol/L   Chloride 105 98 - 110 mmol/L   CO2 25 20 - 31 mmol/L   Glucose, Bld 159 (H) 65 - 99 mg/dL   BUN 17 7 - 25 mg/dL   Creat 9.60 4.54 - 0.98 mg/dL   Total Bilirubin 0.5 0.2 - 1.2 mg/dL   Alkaline Phosphatase 35 (L) 40 - 115 U/L   AST 16 10 - 40 U/L   ALT 20 9 - 46 U/L   Total Protein 6.2 6.1 - 8.1 g/dL   Albumin 4.2 3.6 - 5.1 g/dL   Calcium 9.5 8.6 - 11.9 mg/dL   GFR, Est African American >89 >=60 mL/min   GFR, Est Non African American >89 >=60 mL/min  Hemoglobin A1c  Result Value Ref Range   Hgb A1c MFr Bld 7.4 (H) <5.7 %   Mean Plasma Glucose 166 mg/dL   Complete Blood Count (Most recent): Lab Results  Component Value Date   HGB 16.7 04/22/2008   HCT 49.0 04/22/2008  Chemistry (most recent): Lab Results  Component Value Date   NA 141 10/19/2015   K 4.2 10/19/2015   CL 105 10/19/2015   CO2 25 10/19/2015   BUN 17 10/19/2015   CREATININE 0.74 10/19/2015   Diabetic Labs (most recent): Lab Results  Component Value Date   HGBA1C 7.4 (H) 10/19/2015   HGBA1C 7.1 (H) 06/19/2015   HGBA1C 8.4 (H) 03/13/2015      Assessment & Plan:   1. Uncontrolled type 2 diabetes mellitus with complication, without long-term current use of insulin (HCC) - patient remains at a high risk for more acute and chronic complications of diabetes which include CAD, CVA, CKD, retinopathy, and neuropathy. These are all discussed in detail with the patient.  Patient came with stable  A1c of 7.4% , generally from  8.4%  But he is regaining weight 18 lbs since November.  Recent labs reviewed.   - I have re-counseled the patient on diet management and weight loss  by adopting a carbohydrate restricted / protein rich  Diet.  - Suggestion is made for patient to avoid  simple carbohydrates   from their diet including Cakes , Desserts, Ice Cream,  Soda (  diet and regular) , Sweet Tea , Candies,  Chips, Cookies, Artificial Sweeteners,   and "Sugar-free" Products .  This will help patient to have stable blood glucose profile and potentially avoid unintended  Weight gain.  - Patient is advised to stick to a routine mealtimes to eat 3 meals  a day and avoid unnecessary snacks ( to snack only to correct hypoglycemia).  - The patient  has been  scheduled with Norm SaltPenny Crumpton, RDN, CDE for individualized DM education.  - I have approached patient with the following individualized plan to manage diabetes and patient agrees.  -I will continue Janumet 50/1000mg  po BID. -I will continue jardiance 10 mg by mouth every morning. -he will be considered for GLP 1 inhibitors if he loses control.   - Patient specific target  for A1c; LDL, HDL, Triglycerides, and  Waist Circumference were discussed in detail.  2) BP/HTN: Controlled. Continue current medications including ACEI/ARB. 3) Lipids/HPL:  continue statins. 4)  Weight/Diet: CDE consult in progress, exercise, and carbohydrates information provided.  5) Chronic Care/Health Maintenance:  -Patient is on ACEI/ARB and Statin medications and encouraged to continue to follow up with Ophthalmology, Podiatrist at least yearly or according to recommendations, and advised to  stay away from smoking. I have recommended yearly flu vaccine and pneumonia vaccination at least every 5 years; moderate intensity exercise for up to 150 minutes weekly; and  sleep for at least 7 hours a day.  I advised patient to maintain close follow up with their PCP for primary care needs.  Patient is asked to bring meter and  blood glucose logs during their next visit.   Follow up plan: Return in about 3 months (around 01/24/2016) for follow up with pre-visit labs.  Marquis LunchGebre Nida, MD Phone: [phone]  Fax: [phone]   10/25/2015, 4:17 PM

## 2015-10-25 NOTE — Patient Instructions (Signed)

## 2015-11-26 ENCOUNTER — Other Ambulatory Visit: Payer: Self-pay | Admitting: "Endocrinology

## 2015-12-08 ENCOUNTER — Other Ambulatory Visit: Payer: Self-pay | Admitting: "Endocrinology

## 2015-12-27 ENCOUNTER — Other Ambulatory Visit: Payer: Self-pay | Admitting: "Endocrinology

## 2016-01-24 ENCOUNTER — Ambulatory Visit: Payer: BC Managed Care – PPO | Admitting: "Endocrinology

## 2016-02-12 ENCOUNTER — Other Ambulatory Visit: Payer: Self-pay | Admitting: "Endocrinology

## 2016-02-12 LAB — LIPID PANEL
CHOL/HDL RATIO: 3.9 ratio (ref <5.0)
CHOLESTEROL: 122 mg/dL (ref <200)
HDL: 31 mg/dL — AB (ref >40)
LDL CALC: 81 mg/dL (ref <100)
TRIGLYCERIDES: 51 mg/dL (ref <150)
VLDL: 10 mg/dL (ref <30)

## 2016-02-12 LAB — COMPREHENSIVE METABOLIC PANEL
ALBUMIN: 4.1 g/dL (ref 3.6–5.1)
ALK PHOS: 31 U/L — AB (ref 40–115)
ALT: 17 U/L (ref 9–46)
AST: 16 U/L (ref 10–40)
BILIRUBIN TOTAL: 0.4 mg/dL (ref 0.2–1.2)
BUN: 18 mg/dL (ref 7–25)
CALCIUM: 9.3 mg/dL (ref 8.6–10.3)
CO2: 24 mmol/L (ref 20–31)
Chloride: 108 mmol/L (ref 98–110)
Creat: 0.81 mg/dL (ref 0.60–1.35)
GLUCOSE: 152 mg/dL — AB (ref 65–99)
Potassium: 4.8 mmol/L (ref 3.5–5.3)
Sodium: 143 mmol/L (ref 135–146)
Total Protein: 6 g/dL — ABNORMAL LOW (ref 6.1–8.1)

## 2016-02-12 LAB — MICROALBUMIN / CREATININE URINE RATIO
Creatinine, Urine: 118 mg/dL (ref 20–370)
MICROALB UR: 1.5 mg/dL
Microalb Creat Ratio: 13 mcg/mg creat (ref <30)

## 2016-02-14 LAB — HEMOGLOBIN A1C
Hgb A1c MFr Bld: 7 % — ABNORMAL HIGH (ref <5.7)
Mean Plasma Glucose: 154 mg/dL

## 2016-02-21 ENCOUNTER — Encounter: Payer: Self-pay | Admitting: "Endocrinology

## 2016-02-21 ENCOUNTER — Ambulatory Visit (INDEPENDENT_AMBULATORY_CARE_PROVIDER_SITE_OTHER): Payer: BC Managed Care – PPO | Admitting: "Endocrinology

## 2016-02-21 VITALS — BP 138/86 | HR 76 | Ht 74.0 in | Wt 273.0 lb

## 2016-02-21 DIAGNOSIS — E118 Type 2 diabetes mellitus with unspecified complications: Secondary | ICD-10-CM | POA: Diagnosis not present

## 2016-02-21 DIAGNOSIS — Z6835 Body mass index (BMI) 35.0-35.9, adult: Secondary | ICD-10-CM | POA: Diagnosis not present

## 2016-02-21 DIAGNOSIS — E782 Mixed hyperlipidemia: Secondary | ICD-10-CM | POA: Diagnosis not present

## 2016-02-21 DIAGNOSIS — IMO0001 Reserved for inherently not codable concepts without codable children: Secondary | ICD-10-CM

## 2016-02-21 DIAGNOSIS — I10 Essential (primary) hypertension: Secondary | ICD-10-CM

## 2016-02-21 DIAGNOSIS — E6609 Other obesity due to excess calories: Secondary | ICD-10-CM

## 2016-02-21 DIAGNOSIS — E1165 Type 2 diabetes mellitus with hyperglycemia: Secondary | ICD-10-CM

## 2016-02-21 DIAGNOSIS — IMO0002 Reserved for concepts with insufficient information to code with codable children: Secondary | ICD-10-CM

## 2016-02-21 MED ORDER — SITAGLIPTIN PHOS-METFORMIN HCL 50-1000 MG PO TABS: ORAL_TABLET | ORAL | 0 refills | Status: DC

## 2016-02-21 MED ORDER — SIMVASTATIN 40 MG PO TABS: 40.0000 mg | ORAL_TABLET | Freq: Every evening | ORAL | 0 refills | Status: DC

## 2016-02-21 MED ORDER — LISINOPRIL 20 MG PO TABS: 20.0000 mg | ORAL_TABLET | Freq: Every day | ORAL | 0 refills | Status: DC

## 2016-02-21 MED ORDER — EMPAGLIFLOZIN 10 MG PO TABS: 10.0000 mg | ORAL_TABLET | Freq: Every day | ORAL | 0 refills | Status: DC

## 2016-02-21 NOTE — Progress Notes (Signed)
Subjective:    Patient ID: Preston Mack, male    DOB: 04/08/1965,    Past Medical History:  Diagnosis Date  . Diabetes mellitus, type II (HCC)   . Hyperlipidemia    Past Surgical History:  Procedure Laterality Date  . Lipoma removal    . Osteophyte of bone     Social History   Social History  . Marital status: Married    Spouse name: N/A  . Number of children: N/A  . Years of education: N/A   Social History Main Topics  . Smoking status: Former Games developermoker  . Smokeless tobacco: Never Used  . Alcohol use No  . Drug use: No  . Sexual activity: Not Asked   Other Topics Concern  . None   Social History Narrative  . None   Outpatient Encounter Prescriptions as of 02/21/2016  Medication Sig  . empagliflozin (JARDIANCE) 10 MG TABS tablet Take 10 mg by mouth daily.  Marland Kitchen. glucose blood test strip Use as instructed tid  Dx: E11.65.  Marland Kitchen. lisinopril (PRINIVIL,ZESTRIL) 20 MG tablet Take 1 tablet (20 mg total) by mouth daily.  . simvastatin (ZOCOR) 40 MG tablet Take 1 tablet (40 mg total) by mouth every evening.  . sitaGLIPtin-metformin (JANUMET) 50-1000 MG tablet TAKE (1) TABLET TWICE A DAY WITH FOOD---BREAKFAST AND SUPPER.  . [DISCONTINUED] JANUMET 50-1000 MG tablet TAKE (1) TABLET TWICE A DAY WITH FOOD---BREAKFAST AND SUPPER.  . [DISCONTINUED] JARDIANCE 10 MG TABS tablet TAKE 1 TABLET BY MOUTH ONCE DAILY.  . [DISCONTINUED] lisinopril (PRINIVIL,ZESTRIL) 20 MG tablet TAKE 1 TABLET BY MOUTH ONCE DAILY.  . [DISCONTINUED] simvastatin (ZOCOR) 40 MG tablet TAKE 1 TABLET BY MOUTH ONCE DAILY IN THE EVENING   No facility-administered encounter medications on file as of 02/21/2016.    ALLERGIES: No Known Allergies VACCINATION STATUS:  There is no immunization history on file for this patient.  Diabetes  He presents for his follow-up diabetic visit. He has type 2 diabetes mellitus. Onset time: He was diagnosed at approximate age of 37 years. His disease course has been improving. There  are no hypoglycemic associated symptoms. Pertinent negatives for hypoglycemia include no confusion, headaches, pallor or seizures. There are no diabetic associated symptoms. Pertinent negatives for diabetes include no chest pain, no fatigue, no polydipsia, no polyphagia, no polyuria and no weakness. There are no hypoglycemic complications. Symptoms are improving. There are no diabetic complications. Risk factors for coronary artery disease include diabetes mellitus, dyslipidemia, hypertension, family history, obesity, sedentary lifestyle and tobacco exposure. Current diabetic treatment includes oral agent (dual therapy). He is compliant with treatment most of the time. His weight is stable. He is following a diabetic diet. He has had a previous visit with a dietitian. He participates in exercise intermittently. An ACE inhibitor/angiotensin II receptor blocker is being taken. Eye exam is current.  Hyperlipidemia  This is a chronic problem. The current episode started more than 1 year ago. The problem is controlled. Exacerbating diseases include diabetes. Pertinent negatives include no chest pain, myalgias or shortness of breath. Current antihyperlipidemic treatment includes statins. Risk factors for coronary artery disease include diabetes mellitus, dyslipidemia, hypertension, male sex, a sedentary lifestyle and obesity.  Hypertension  This is a chronic problem. The current episode started more than 1 year ago. Pertinent negatives include no chest pain, headaches, neck pain, palpitations or shortness of breath. Risk factors for coronary artery disease include dyslipidemia, diabetes mellitus and obesity. Past treatments include ACE inhibitors.     Review of  Systems  Constitutional: Negative for fatigue and unexpected weight change.  HENT: Negative for dental problem, mouth sores and trouble swallowing.   Eyes: Negative for visual disturbance.  Respiratory: Negative for cough, choking, chest tightness,  shortness of breath and wheezing.   Cardiovascular: Negative for chest pain, palpitations and leg swelling.  Gastrointestinal: Negative for abdominal distention, abdominal pain, constipation, diarrhea, nausea and vomiting.  Endocrine: Negative for polydipsia, polyphagia and polyuria.  Genitourinary: Negative for dysuria, flank pain, hematuria and urgency.  Musculoskeletal: Negative for back pain, gait problem, myalgias and neck pain.  Skin: Negative for pallor, rash and wound.  Neurological: Negative for seizures, syncope, weakness, numbness and headaches.  Psychiatric/Behavioral: Negative.  Negative for confusion and dysphoric mood.    Objective:    BP 138/86   Pulse 76   Ht 6\' 2"  (1.88 m)   Wt 273 lb (123.8 kg)   BMI 35.05 kg/m   Wt Readings from Last 3 Encounters:  02/21/16 273 lb (123.8 kg)  10/25/15 280 lb (127 kg)  06/28/15 278 lb (126.1 kg)    Physical Exam  Constitutional: He is oriented to person, place, and time. He appears well-developed and well-nourished. He is cooperative. No distress.  HENT:  Head: Normocephalic and atraumatic.  Eyes: EOM are normal.  Neck: Normal range of motion. Neck supple. No tracheal deviation present. No thyromegaly present.  Cardiovascular: Normal rate, S1 normal, S2 normal and normal heart sounds.  Exam reveals no gallop.   No murmur heard. Pulses:      Dorsalis pedis pulses are 1+ on the right side, and 1+ on the left side.       Posterior tibial pulses are 1+ on the right side, and 1+ on the left side.  Pulmonary/Chest: Breath sounds normal. No respiratory distress. He has no wheezes.  Abdominal: Soft. Bowel sounds are normal. He exhibits no distension. There is no tenderness. There is no guarding and no CVA tenderness.  Musculoskeletal: He exhibits no edema.       Right shoulder: He exhibits no swelling and no deformity.  Neurological: He is alert and oriented to person, place, and time. He has normal strength and normal reflexes. No  cranial nerve deficit or sensory deficit. Gait normal.  Skin: Skin is warm and dry. No rash noted. No cyanosis. Nails show no clubbing.  Psychiatric: He has a normal mood and affect. His speech is normal and behavior is normal. Judgment and thought content normal. Cognition and memory are normal.    Results for orders placed or performed in visit on 02/12/16  Microalbumin / creatinine urine ratio  Result Value Ref Range   Creatinine, Urine 118 20 - 370 mg/dL   Microalb, Ur 1.5 Not estab mg/dL   Microalb Creat Ratio 13 <30 mcg/mg creat  Comprehensive metabolic panel  Result Value Ref Range   Sodium 143 135 - 146 mmol/L   Potassium 4.8 3.5 - 5.3 mmol/L   Chloride 108 98 - 110 mmol/L   CO2 24 20 - 31 mmol/L   Glucose, Bld 152 (H) 65 - 99 mg/dL   BUN 18 7 - 25 mg/dL   Creat 1.61 0.96 - 0.45 mg/dL   Total Bilirubin 0.4 0.2 - 1.2 mg/dL   Alkaline Phosphatase 31 (L) 40 - 115 U/L   AST 16 10 - 40 U/L   ALT 17 9 - 46 U/L   Total Protein 6.0 (L) 6.1 - 8.1 g/dL   Albumin 4.1 3.6 - 5.1 g/dL   Calcium 9.3  8.6 - 10.3 mg/dL  Lipid panel  Result Value Ref Range   Cholesterol 122 <200 mg/dL   Triglycerides 51 <161 mg/dL   HDL 31 (L) >09 mg/dL   Total CHOL/HDL Ratio 3.9 <5.0 Ratio   VLDL 10 <30 mg/dL   LDL Cholesterol 81 <604 mg/dL  Hemoglobin V4U  Result Value Ref Range   Hgb A1c MFr Bld 7.0 (H) <5.7 %   Mean Plasma Glucose 154 mg/dL   Complete Blood Count (Most recent): Lab Results  Component Value Date   HGB 16.7 04/22/2008   HCT 49.0 04/22/2008   Chemistry (most recent): Lab Results  Component Value Date   NA 143 02/12/2016   K 4.8 02/12/2016   CL 108 02/12/2016   CO2 24 02/12/2016   BUN 18 02/12/2016   CREATININE 0.81 02/12/2016   Diabetic Labs (most recent): Lab Results  Component Value Date   HGBA1C 7.0 (H) 02/12/2016   HGBA1C 7.4 (H) 10/19/2015   HGBA1C 7.1 (H) 06/19/2015      Assessment & Plan:   1. Uncontrolled type 2 diabetes mellitus with complication,  without long-term current use of insulin (HCC) - patient remains at a high risk for more acute and chronic complications of diabetes which include CAD, CVA, CKD, retinopathy, and neuropathy. These are all discussed in detail with the patient.  Patient came with stable  A1c of 7% , generally from  8.4%  But he is regaining weight 18 lbs since November.  Recent labs reviewed.   - I have re-counseled the patient on diet management and weight loss  by adopting a carbohydrate restricted / protein rich  Diet.  - Suggestion is made for patient to avoid simple carbohydrates   from their diet including Cakes , Desserts, Ice Cream,  Soda (  diet and regular) , Sweet Tea , Candies,  Chips, Cookies, Artificial Sweeteners,   and "Sugar-free" Products .  This will help patient to have stable blood glucose profile and potentially avoid unintended  Weight gain.  - Patient is advised to stick to a routine mealtimes to eat 3 meals  a day and avoid unnecessary snacks ( to snack only to correct hypoglycemia).  - The patient  has been  scheduled with Norm Salt, RDN, CDE for individualized DM education.  - I have approached patient with the following individualized plan to manage diabetes and patient agrees.  -I will continue Janumet 50/1000mg  po BID. -I will continue Jardiance 10 mg by mouth every morning. -he will be considered for GLP 1 inhibitors if he loses control.   - Patient specific target  for A1c; LDL, HDL, Triglycerides, and  Waist Circumference were discussed in detail.  2) BP/HTN: Controlled. Continue current medications including ACEI/ARB. 3) Lipids/HPL:  continue statins. 4)  Weight/Diet: CDE consult in progress, exercise, and carbohydrates information provided.  5) Chronic Care/Health Maintenance:  -Patient is on ACEI/ARB and Statin medications and encouraged to continue to follow up with Ophthalmology, Podiatrist at least yearly or according to recommendations, and advised to  stay away  from smoking. I have recommended yearly flu vaccine and pneumonia vaccination at least every 5 years; moderate intensity exercise for up to 150 minutes weekly; and  sleep for at least 7 hours a day.  I advised patient to maintain close follow up with their PCP for primary care needs.  Patient is asked to bring meter and  blood glucose logs during their next visit.   Follow up plan: Return in about 3 months (around  05/21/2016) for follow up with pre-visit labs, meter, and logs.  Marquis Lunch, MD Phone: [phone]  Fax: [phone]   02/21/2016, 3:13 PM

## 2016-02-21 NOTE — Patient Instructions (Signed)

## 2016-03-15 ENCOUNTER — Encounter: Payer: Self-pay | Admitting: "Endocrinology

## 2016-05-22 ENCOUNTER — Ambulatory Visit: Payer: BC Managed Care – PPO | Admitting: "Endocrinology

## 2016-06-06 ENCOUNTER — Other Ambulatory Visit: Payer: Self-pay | Admitting: "Endocrinology

## 2016-06-15 ENCOUNTER — Other Ambulatory Visit: Payer: Self-pay | Admitting: "Endocrinology

## 2016-06-22 ENCOUNTER — Other Ambulatory Visit: Payer: Self-pay | Admitting: "Endocrinology

## 2016-06-29 ENCOUNTER — Other Ambulatory Visit: Payer: Self-pay | Admitting: "Endocrinology

## 2016-06-29 LAB — COMPREHENSIVE METABOLIC PANEL
ALT: 17 U/L (ref 9–46)
AST: 18 U/L (ref 10–40)
Albumin: 4.3 g/dL (ref 3.6–5.1)
Alkaline Phosphatase: 34 U/L — ABNORMAL LOW (ref 40–115)
BUN: 19 mg/dL (ref 7–25)
CHLORIDE: 106 mmol/L (ref 98–110)
CO2: 26 mmol/L (ref 20–31)
Calcium: 9.5 mg/dL (ref 8.6–10.3)
Creat: 0.87 mg/dL (ref 0.60–1.35)
Glucose, Bld: 146 mg/dL — ABNORMAL HIGH (ref 65–99)
POTASSIUM: 4.3 mmol/L (ref 3.5–5.3)
Sodium: 140 mmol/L (ref 135–146)
Total Bilirubin: 0.4 mg/dL (ref 0.2–1.2)
Total Protein: 6.5 g/dL (ref 6.1–8.1)

## 2016-06-30 LAB — HEMOGLOBIN A1C
Hgb A1c MFr Bld: 7.7 % — ABNORMAL HIGH (ref <5.7)
Mean Plasma Glucose: 174 mg/dL

## 2016-07-05 ENCOUNTER — Other Ambulatory Visit: Payer: Self-pay | Admitting: "Endocrinology

## 2016-07-06 ENCOUNTER — Encounter: Payer: Self-pay | Admitting: "Endocrinology

## 2016-07-06 ENCOUNTER — Ambulatory Visit (INDEPENDENT_AMBULATORY_CARE_PROVIDER_SITE_OTHER): Payer: BC Managed Care – PPO | Admitting: "Endocrinology

## 2016-07-06 VITALS — BP 111/76 | HR 72 | Ht 74.0 in | Wt 270.0 lb

## 2016-07-06 DIAGNOSIS — E118 Type 2 diabetes mellitus with unspecified complications: Secondary | ICD-10-CM

## 2016-07-06 DIAGNOSIS — E6609 Other obesity due to excess calories: Secondary | ICD-10-CM | POA: Diagnosis not present

## 2016-07-06 DIAGNOSIS — IMO0002 Reserved for concepts with insufficient information to code with codable children: Secondary | ICD-10-CM

## 2016-07-06 DIAGNOSIS — E1165 Type 2 diabetes mellitus with hyperglycemia: Secondary | ICD-10-CM

## 2016-07-06 DIAGNOSIS — Z6835 Body mass index (BMI) 35.0-35.9, adult: Secondary | ICD-10-CM | POA: Diagnosis not present

## 2016-07-06 DIAGNOSIS — E782 Mixed hyperlipidemia: Secondary | ICD-10-CM

## 2016-07-06 DIAGNOSIS — I10 Essential (primary) hypertension: Secondary | ICD-10-CM | POA: Diagnosis not present

## 2016-07-06 DIAGNOSIS — IMO0001 Reserved for inherently not codable concepts without codable children: Secondary | ICD-10-CM

## 2016-07-06 MED ORDER — EMPAGLIFLOZIN 25 MG PO TABS: 25.0000 mg | ORAL_TABLET | Freq: Every day | ORAL | 3 refills | Status: DC

## 2016-07-06 NOTE — Progress Notes (Signed)
Subjective:    Patient ID: Preston Mack, male    DOB: 01-10-1966,    Past Medical History:  Diagnosis Date  . Diabetes mellitus, type II (HCC)   . Hyperlipidemia    Past Surgical History:  Procedure Laterality Date  . Lipoma removal    . Osteophyte of bone     Social History   Social History  . Marital status: Married    Spouse name: N/A  . Number of children: N/A  . Years of education: N/A   Social History Main Topics  . Smoking status: Former Games developer  . Smokeless tobacco: Never Used  . Alcohol use No  . Drug use: No  . Sexual activity: Not Asked   Other Topics Concern  . None   Social History Narrative  . None   Outpatient Encounter Prescriptions as of 07/06/2016  Medication Sig  . empagliflozin (JARDIANCE) 25 MG TABS tablet Take 25 mg by mouth daily.  Marland Kitchen glucose blood test strip Use as instructed tid  Dx: E11.65.  Marland Kitchen JANUMET 50-1000 MG tablet TAKE (1) TABLET TWICE A DAY WITH FOOD---BREAKFAST AND SUPPER.  Marland Kitchen lisinopril (PRINIVIL,ZESTRIL) 20 MG tablet TAKE 1 TABLET BY MOUTH ONCE DAILY.  . simvastatin (ZOCOR) 40 MG tablet TAKE 1 TABLET BY MOUTH ONCE DAILY IN THE EVENING  . [DISCONTINUED] JARDIANCE 10 MG TABS tablet TAKE 1 TABLET BY MOUTH ONCE DAILY.   No facility-administered encounter medications on file as of 07/06/2016.    ALLERGIES: No Known Allergies VACCINATION STATUS:  There is no immunization history on file for this patient.  Diabetes  He presents for his follow-up diabetic visit. He has type 2 diabetes mellitus. Onset time: He was diagnosed at approximate age of 37 years. His disease course has been improving. There are no hypoglycemic associated symptoms. Pertinent negatives for hypoglycemia include no confusion, headaches, pallor or seizures. There are no diabetic associated symptoms. Pertinent negatives for diabetes include no chest pain, no fatigue, no polydipsia, no polyphagia, no polyuria and no weakness. There are no hypoglycemic complications.  Symptoms are improving. There are no diabetic complications. Risk factors for coronary artery disease include diabetes mellitus, dyslipidemia, hypertension, family history, obesity, sedentary lifestyle and tobacco exposure. Current diabetic treatment includes oral agent (dual therapy). He is compliant with treatment most of the time. His weight is stable. He is following a diabetic diet. He has had a previous visit with a dietitian. He participates in exercise intermittently. An ACE inhibitor/angiotensin II receptor blocker is being taken. Eye exam is current.  Hyperlipidemia  This is a chronic problem. The current episode started more than 1 year ago. The problem is controlled. Exacerbating diseases include diabetes. Pertinent negatives include no chest pain, myalgias or shortness of breath. Current antihyperlipidemic treatment includes statins. Risk factors for coronary artery disease include diabetes mellitus, dyslipidemia, hypertension, male sex, a sedentary lifestyle and obesity.  Hypertension  This is a chronic problem. The current episode started more than 1 year ago. Pertinent negatives include no chest pain, headaches, neck pain, palpitations or shortness of breath. Risk factors for coronary artery disease include dyslipidemia, diabetes mellitus and obesity. Past treatments include ACE inhibitors.     Review of Systems  Constitutional: Negative for fatigue and unexpected weight change.  HENT: Negative for dental problem, mouth sores and trouble swallowing.   Eyes: Negative for visual disturbance.  Respiratory: Negative for cough, choking, chest tightness, shortness of breath and wheezing.   Cardiovascular: Negative for chest pain, palpitations and leg swelling.  Gastrointestinal: Negative for abdominal distention, abdominal pain, constipation, diarrhea, nausea and vomiting.  Endocrine: Negative for polydipsia, polyphagia and polyuria.  Genitourinary: Negative for dysuria, flank pain,  hematuria and urgency.  Musculoskeletal: Negative for back pain, gait problem, myalgias and neck pain.  Skin: Negative for pallor, rash and wound.  Neurological: Negative for seizures, syncope, weakness, numbness and headaches.  Psychiatric/Behavioral: Negative.  Negative for confusion and dysphoric mood.    Objective:    BP 111/76   Pulse 72   Ht 6\' 2"  (1.88 m)   Wt 270 lb (122.5 kg)   BMI 34.67 kg/m   Wt Readings from Last 3 Encounters:  07/06/16 270 lb (122.5 kg)  02/21/16 273 lb (123.8 kg)  10/25/15 280 lb (127 kg)    Physical Exam  Constitutional: He is oriented to person, place, and time. He appears well-developed and well-nourished. He is cooperative. No distress.  HENT:  Head: Normocephalic and atraumatic.  Eyes: EOM are normal.  Neck: Normal range of motion. Neck supple. No tracheal deviation present. No thyromegaly present.  Cardiovascular: Normal rate, S1 normal, S2 normal and normal heart sounds.  Exam reveals no gallop.   No murmur heard. Pulses:      Dorsalis pedis pulses are 1+ on the right side, and 1+ on the left side.       Posterior tibial pulses are 1+ on the right side, and 1+ on the left side.  Pulmonary/Chest: Breath sounds normal. No respiratory distress. He has no wheezes.  Abdominal: Soft. Bowel sounds are normal. He exhibits no distension. There is no tenderness. There is no guarding and no CVA tenderness.  Musculoskeletal: He exhibits no edema.       Right shoulder: He exhibits no swelling and no deformity.  Neurological: He is alert and oriented to person, place, and time. He has normal strength and normal reflexes. No cranial nerve deficit or sensory deficit. Gait normal.  Skin: Skin is warm and dry. No rash noted. No cyanosis. Nails show no clubbing.  Psychiatric: He has a normal mood and affect. His speech is normal and behavior is normal. Judgment and thought content normal. Cognition and memory are normal.    Results for orders placed or  performed in visit on 06/29/16  Comprehensive metabolic panel  Result Value Ref Range   Sodium 140 135 - 146 mmol/L   Potassium 4.3 3.5 - 5.3 mmol/L   Chloride 106 98 - 110 mmol/L   CO2 26 20 - 31 mmol/L   Glucose, Bld 146 (H) 65 - 99 mg/dL   BUN 19 7 - 25 mg/dL   Creat 1.61 0.96 - 0.45 mg/dL   Total Bilirubin 0.4 0.2 - 1.2 mg/dL   Alkaline Phosphatase 34 (L) 40 - 115 U/L   AST 18 10 - 40 U/L   ALT 17 9 - 46 U/L   Total Protein 6.5 6.1 - 8.1 g/dL   Albumin 4.3 3.6 - 5.1 g/dL   Calcium 9.5 8.6 - 40.9 mg/dL  Hemoglobin W1X  Result Value Ref Range   Hgb A1c MFr Bld 7.7 (H) <5.7 %   Mean Plasma Glucose 174 mg/dL   Complete Blood Count (Most recent): Lab Results  Component Value Date   HGB 16.7 04/22/2008   HCT 49.0 04/22/2008   Chemistry (most recent): Lab Results  Component Value Date   NA 140 06/29/2016   K 4.3 06/29/2016   CL 106 06/29/2016   CO2 26 06/29/2016   BUN 19 06/29/2016   CREATININE  0.87 06/29/2016   Diabetic Labs (most recent): Lab Results  Component Value Date   HGBA1C 7.7 (H) 06/29/2016   HGBA1C 7.0 (H) 02/12/2016   HGBA1C 7.4 (H) 10/19/2015      Assessment & Plan:   1. Uncontrolled type 2 diabetes mellitus with complication, without long-term current use of insulin (HCC) - patient remains at a high risk for more acute and chronic complications of diabetes which include CAD, CVA, CKD, retinopathy, and neuropathy. These are all discussed in detail with the patient.  Patient came with   A1c of 7.7% , generally from  8.4% .   Recent labs reviewed.   - I have re-counseled the patient on diet management and weight loss  by adopting a carbohydrate restricted / protein rich  Diet.  - Suggestion is made for patient to avoid simple carbohydrates   from his diet including Cakes , Desserts, Ice Cream,  Soda (  diet and regular) , Sweet Tea , Candies,  Chips, Cookies, Artificial Sweeteners,   and "Sugar-free" Products .  This will help patient to have stable  blood glucose profile and potentially avoid unintended  Weight gain.  - Patient is advised to stick to a routine mealtimes to eat 3 meals  a day and avoid unnecessary snacks ( to snack only to correct hypoglycemia).  - The patient  has been  scheduled with Norm SaltPenny Crumpton, RDN, CDE for individualized DM education.  - I have approached patient with the following individualized plan to manage diabetes and patient agrees.  -I will continue Janumet 50/1000mg  po BID. -I will increase Jardiance t0 25 mg by mouth every morning. -he will be considered for GLP 1 inhibitors if he loses control.  - Patient specific target  for A1c; LDL, HDL, Triglycerides, and  Waist Circumference were discussed in detail.  2) BP/HTN: Controlled. Continue current medications including ACEI/ARB. 3) Lipids/HPL:  continue statins. 4)  Weight/Diet: CDE consult in progress, exercise, and carbohydrates information provided.  5) Chronic Care/Health Maintenance:  -Patient is on ACEI/ARB and Statin medications and encouraged to continue to follow up with Ophthalmology, Podiatrist at least yearly or according to recommendations, and advised to  stay away from smoking. I have recommended yearly flu vaccine and pneumonia vaccination at least every 5 years; moderate intensity exercise for up to 150 minutes weekly; and  sleep for at least 7 hours a day.  I advised patient to maintain close follow up with their PCP for primary care needs.   Follow up plan: Return in about 3 months (around 10/06/2016) for follow up with pre-visit labs.  Marquis LunchGebre Catherine Cubero, MD Phone: [phone]  Fax: [phone]   07/06/2016, 4:21 PM

## 2016-10-05 ENCOUNTER — Other Ambulatory Visit: Payer: Self-pay | Admitting: "Endocrinology

## 2016-10-05 ENCOUNTER — Ambulatory Visit: Payer: BC Managed Care – PPO | Admitting: "Endocrinology

## 2016-10-26 ENCOUNTER — Ambulatory Visit: Payer: BC Managed Care – PPO | Admitting: "Endocrinology

## 2016-11-02 LAB — RENAL FUNCTION PANEL
Albumin: 4.3 g/dL (ref 3.6–5.1)
BUN: 17 mg/dL (ref 7–25)
CO2: 25 mmol/L (ref 20–32)
Calcium: 9.1 mg/dL (ref 8.6–10.3)
Chloride: 106 mmol/L (ref 98–110)
Creat: 0.82 mg/dL (ref 0.60–1.35)
Glucose, Bld: 162 mg/dL — ABNORMAL HIGH (ref 65–99)
POTASSIUM: 4.1 mmol/L (ref 3.5–5.3)
Phosphorus: 3.9 mg/dL (ref 2.5–4.5)
SODIUM: 139 mmol/L (ref 135–146)

## 2016-11-02 LAB — HEMOGLOBIN A1C
EAG (MMOL/L): 9.2 (calc)
Hgb A1c MFr Bld: 7.4 % of total Hgb — ABNORMAL HIGH (ref <5.7)
MEAN PLASMA GLUCOSE: 166 (calc)

## 2016-11-08 ENCOUNTER — Ambulatory Visit (INDEPENDENT_AMBULATORY_CARE_PROVIDER_SITE_OTHER): Payer: BC Managed Care – PPO | Admitting: "Endocrinology

## 2016-11-08 ENCOUNTER — Encounter: Payer: Self-pay | Admitting: "Endocrinology

## 2016-11-08 VITALS — BP 115/79 | HR 72 | Ht 74.0 in | Wt 270.0 lb

## 2016-11-08 DIAGNOSIS — I10 Essential (primary) hypertension: Secondary | ICD-10-CM | POA: Diagnosis not present

## 2016-11-08 DIAGNOSIS — E118 Type 2 diabetes mellitus with unspecified complications: Secondary | ICD-10-CM

## 2016-11-08 DIAGNOSIS — E1165 Type 2 diabetes mellitus with hyperglycemia: Secondary | ICD-10-CM

## 2016-11-08 DIAGNOSIS — IMO0002 Reserved for concepts with insufficient information to code with codable children: Secondary | ICD-10-CM

## 2016-11-08 DIAGNOSIS — E782 Mixed hyperlipidemia: Secondary | ICD-10-CM | POA: Diagnosis not present

## 2016-11-08 NOTE — Patient Instructions (Signed)

## 2016-11-08 NOTE — Progress Notes (Signed)
Subjective:    Patient ID: Preston Mack, male    DOB: 16-Apr-1965,    Past Medical History:  Diagnosis Date  . Diabetes mellitus, type II (HCC)   . Hyperlipidemia    Past Surgical History:  Procedure Laterality Date  . Lipoma removal    . Osteophyte of bone     Social History   Social History  . Marital status: Married    Spouse name: N/A  . Number of children: N/A  . Years of education: N/A   Social History Main Topics  . Smoking status: Former Games developer  . Smokeless tobacco: Never Used  . Alcohol use No  . Drug use: No  . Sexual activity: Not Asked   Other Topics Concern  . None   Social History Narrative  . None   Outpatient Encounter Prescriptions as of 11/08/2016  Medication Sig  . glucose blood test strip Use as instructed tid  Dx: E11.65.  Marland Kitchen JANUMET 50-1000 MG tablet TAKE (1) TABLET TWICE A DAY WITH FOOD---BREAKFAST AND SUPPER.  Marland Kitchen lisinopril (PRINIVIL,ZESTRIL) 20 MG tablet TAKE 1 TABLET BY MOUTH ONCE DAILY.  . simvastatin (ZOCOR) 40 MG tablet TAKE 1 TABLET BY MOUTH ONCE DAILY IN THE EVENING  . [DISCONTINUED] empagliflozin (JARDIANCE) 25 MG TABS tablet Take 25 mg by mouth daily.   No facility-administered encounter medications on file as of 11/08/2016.    ALLERGIES: No Known Allergies VACCINATION STATUS:  There is no immunization history on file for this patient.  Diabetes  He presents for his follow-up diabetic visit. He has type 2 diabetes mellitus. Onset time: He was diagnosed at approximate age of 37 years. His disease course has been stable. There are no hypoglycemic associated symptoms. Pertinent negatives for hypoglycemia include no confusion, headaches, pallor or seizures. There are no diabetic associated symptoms. Pertinent negatives for diabetes include no chest pain, no fatigue, no polydipsia, no polyphagia, no polyuria and no weakness. There are no hypoglycemic complications. Symptoms are stable. There are no diabetic complications. Risk factors  for coronary artery disease include diabetes mellitus, dyslipidemia, hypertension, family history, obesity, sedentary lifestyle and tobacco exposure. Current diabetic treatment includes oral agent (dual therapy). He is compliant with treatment most of the time. His weight is stable. He is following a diabetic diet. He has had a previous visit with a dietitian. He participates in exercise intermittently. An ACE inhibitor/angiotensin II receptor blocker is being taken. Eye exam is current.  Hyperlipidemia  This is a chronic problem. The current episode started more than 1 year ago. The problem is controlled. Exacerbating diseases include diabetes. Pertinent negatives include no chest pain, myalgias or shortness of breath. Current antihyperlipidemic treatment includes statins. Risk factors for coronary artery disease include diabetes mellitus, dyslipidemia, hypertension, male sex, a sedentary lifestyle and obesity.  Hypertension  This is a chronic problem. The current episode started more than 1 year ago. Pertinent negatives include no chest pain, headaches, neck pain, palpitations or shortness of breath. Risk factors for coronary artery disease include dyslipidemia, diabetes mellitus and obesity. Past treatments include ACE inhibitors.     Review of Systems  Constitutional: Negative for fatigue and unexpected weight change.  HENT: Negative for dental problem, mouth sores and trouble swallowing.   Eyes: Negative for visual disturbance.  Respiratory: Negative for cough, choking, chest tightness, shortness of breath and wheezing.   Cardiovascular: Negative for chest pain, palpitations and leg swelling.  Gastrointestinal: Negative for abdominal distention, abdominal pain, constipation, diarrhea, nausea and vomiting.  Endocrine:  Negative for polydipsia, polyphagia and polyuria.  Genitourinary: Negative for dysuria, flank pain, hematuria and urgency.  Musculoskeletal: Negative for back pain, gait problem,  myalgias and neck pain.  Skin: Negative for pallor, rash and wound.  Neurological: Negative for seizures, syncope, weakness, numbness and headaches.  Psychiatric/Behavioral: Negative.  Negative for confusion and dysphoric mood.    Objective:    BP 115/79   Pulse 72   Ht  (1.88 m)   Wt 270 lb (122.5 kg)   BMI 34.67 kg/m   Wt Readings from Last 3 Encounters:  11/08/16 270 lb (122.5 kg)  07/06/16 270 lb (122.5 kg)  02/21/16 273 lb (123.8 kg)    Physical Exam  Constitutional: He is oriented to person, place, and time. He appears well-developed and well-nourished. He is cooperative. No distress.  HENT:  Head: Normocephalic and atraumatic.  Eyes: EOM are normal.  Neck: Normal range of motion. Neck supple. No tracheal deviation present. No thyromegaly present.  Cardiovascular: Normal rate, S1 normal, S2 normal and normal heart sounds.  Exam reveals no gallop.   No murmur heard. Pulses:      Dorsalis pedis pulses are 1+ on the right side, and 1+ on the left side.       Posterior tibial pulses are 1+ on the right side, and 1+ on the left side.  Pulmonary/Chest: Breath sounds normal. No respiratory distress. He has no wheezes.  Abdominal: Soft. Bowel sounds are normal. He exhibits no distension. There is no tenderness. There is no guarding and no CVA tenderness.  Musculoskeletal: He exhibits no edema.       Right shoulder: He exhibits no swelling and no deformity.  Neurological: He is alert and oriented to person, place, and time. He has normal strength and normal reflexes. No cranial nerve deficit or sensory deficit. Gait normal.  Skin: Skin is warm and dry. No rash noted. No cyanosis. Nails show no clubbing.  Psychiatric: He has a normal mood and affect. His speech is normal and behavior is normal. Judgment and thought content normal. Cognition and memory are normal.    Results for orders placed or performed in visit on 07/06/16  Renal function panel  Result Value Ref Range    Glucose, Bld 162 (H) 65 - 99 mg/dL   BUN 17 7 - 25 mg/dL   Creat 9.56 2.13 - 0.86 mg/dL   BUN/Creatinine Ratio NOT APPLICABLE 6 - 22 (calc)   Sodium 139 135 - 146 mmol/L   Potassium 4.1 3.5 - 5.3 mmol/L   Chloride 106 98 - 110 mmol/L   CO2 25 20 - 32 mmol/L   Calcium 9.1 8.6 - 10.3 mg/dL   Phosphorus 3.9 2.5 - 4.5 mg/dL   Albumin 4.3 3.6 - 5.1 g/dL  Hemoglobin V7Q  Result Value Ref Range   Hgb A1c MFr Bld 7.4 (H) <5.7 % of total Hgb   Mean Plasma Glucose 166 (calc)   eAG (mmol/L) 9.2 (calc)   Complete Blood Count (Most recent): Lab Results  Component Value Date   HGB 16.7 04/22/2008   HCT 49.0 04/22/2008   Chemistry (most recent): Lab Results  Component Value Date   NA 139 11/01/2016   K 4.1 11/01/2016   CL 106 11/01/2016   CO2 25 11/01/2016   BUN 17 11/01/2016   CREATININE 0.82 11/01/2016   Diabetic Labs (most recent): Lab Results  Component Value Date   HGBA1C 7.4 (H) 11/01/2016   HGBA1C 7.7 (H) 06/29/2016   HGBA1C 7.0 (H)  02/12/2016      Assessment & Plan:   1. Uncontrolled type 2 diabetes mellitus with complication, without long-term current use of insulin (HCC) - patient remains at a high risk for more acute and chronic complications of diabetes which include CAD, CVA, CKD, retinopathy, and neuropathy. These are all discussed in detail with the patient.  Patient came with   A1c of 7.4% , generally from  8.4% .   Recent labs reviewed.   - I have re-counseled the patient on diet management and weight loss  by adopting a carbohydrate restricted / protein rich  Diet.  - Suggestion is made for him to avoid simple carbohydrates  from his diet including Cakes, Sweet Desserts, Ice Cream, Soda (diet and regular), Sweet Tea, Candies, Chips, Cookies, Store Bought Juices, Alcohol in Excess of  1-2 drinks a day, Artificial Sweeteners, and "Sugar-free" Products. This will help patient to have stable blood glucose profile and potentially avoid unintended weight  gain.   - Patient is advised to stick to a routine mealtimes to eat 3 meals  a day and avoid unnecessary snacks ( to snack only to correct hypoglycemia).  - The patient  has been  scheduled with Norm Salt, RDN, CDE for individualized DM education.  - I have approached patient with the following individualized plan to manage diabetes and patient agrees.  -I will continue Janumet 50/1000mg  po BID. -I will discontinue  Jardiance after his current supplies run out.  -he will be considered for a basal insulin if he loses control.  - Patient specific target  for A1c; LDL, HDL, Triglycerides, and  Waist Circumference were discussed in detail.  2) BP/HTN: Controlled. Continue current medications including ACEI/ARB. 3) Lipids/HPL:  continue statins. 4)  Weight/Diet: CDE consult in progress, exercise, and carbohydrates information provided.  5) Chronic Care/Health Maintenance:  -Patient is on ACEI/ARB and Statin medications and encouraged to continue to follow up with Ophthalmology, Podiatrist at least yearly or according to recommendations, and advised to  stay away from smoking. I have recommended yearly flu vaccine and pneumonia vaccination at least every 5 years; moderate intensity exercise for up to 150 minutes weekly; and  sleep for at least 7 hours a day.  I advised patient to maintain close follow up with his PCP for primary care needs.  - Time spent with the patient: 25 min, of which >50% was spent in reviewing his sugar logs , discussing his hypo- and hyper-glycemic episodes, reviewing his current and  previous labs and insulin doses and developing a plan to avoid hypo- and hyper-glycemia.   Follow up plan: Return in about 3 months (around 02/08/2017) for follow up with pre-visit labs.  Marquis Lunch, MD Phone: [phone]  Fax: [phone]  This note was partially dictated with voice recognition software. Similar sounding words can be transcribed inadequately or may not  be  corrected upon review.  11/08/2016, 4:34 PM

## 2016-12-22 ENCOUNTER — Other Ambulatory Visit: Payer: Self-pay | Admitting: "Endocrinology

## 2017-01-05 ENCOUNTER — Other Ambulatory Visit: Payer: Self-pay | Admitting: "Endocrinology

## 2017-02-08 ENCOUNTER — Ambulatory Visit: Payer: BC Managed Care – PPO | Admitting: "Endocrinology

## 2017-03-01 ENCOUNTER — Ambulatory Visit: Payer: BC Managed Care – PPO | Admitting: "Endocrinology

## 2017-03-09 LAB — COMPLETE METABOLIC PANEL WITH GFR
AG RATIO: 1.8 (calc) (ref 1.0–2.5)
ALT: 25 U/L (ref 9–46)
AST: 18 U/L (ref 10–40)
Albumin: 4.2 g/dL (ref 3.6–5.1)
Alkaline phosphatase (APISO): 39 U/L — ABNORMAL LOW (ref 40–115)
BILIRUBIN TOTAL: 0.5 mg/dL (ref 0.2–1.2)
BUN: 17 mg/dL (ref 7–25)
CHLORIDE: 106 mmol/L (ref 98–110)
CO2: 27 mmol/L (ref 20–32)
Calcium: 9.5 mg/dL (ref 8.6–10.3)
Creat: 0.83 mg/dL (ref 0.60–1.35)
GFR, EST AFRICAN AMERICAN: 120 mL/min/{1.73_m2} (ref >=60)
GFR, Est Non African American: 103 mL/min/{1.73_m2} (ref >=60)
GLUCOSE: 287 mg/dL — AB (ref 65–99)
Globulin: 2.3 g/dL (calc) (ref 1.9–3.7)
POTASSIUM: 4.2 mmol/L (ref 3.5–5.3)
Sodium: 141 mmol/L (ref 135–146)
TOTAL PROTEIN: 6.5 g/dL (ref 6.1–8.1)

## 2017-03-09 LAB — HEMOGLOBIN A1C
EAG (MMOL/L): 15.7 (calc)
Hgb A1c MFr Bld: 11.5 % of total Hgb — ABNORMAL HIGH (ref <5.7)
MEAN PLASMA GLUCOSE: 283 (calc)

## 2017-03-14 ENCOUNTER — Ambulatory Visit: Payer: BC Managed Care – PPO | Admitting: "Endocrinology

## 2017-03-14 ENCOUNTER — Encounter: Payer: Self-pay | Admitting: "Endocrinology

## 2017-03-14 VITALS — BP 122/78 | HR 77 | Ht 74.0 in | Wt 269.0 lb

## 2017-03-14 DIAGNOSIS — E1165 Type 2 diabetes mellitus with hyperglycemia: Secondary | ICD-10-CM | POA: Diagnosis not present

## 2017-03-14 DIAGNOSIS — Z6834 Body mass index (BMI) 34.0-34.9, adult: Secondary | ICD-10-CM

## 2017-03-14 DIAGNOSIS — E6609 Other obesity due to excess calories: Secondary | ICD-10-CM

## 2017-03-14 DIAGNOSIS — E118 Type 2 diabetes mellitus with unspecified complications: Secondary | ICD-10-CM

## 2017-03-14 DIAGNOSIS — I10 Essential (primary) hypertension: Secondary | ICD-10-CM

## 2017-03-14 DIAGNOSIS — E782 Mixed hyperlipidemia: Secondary | ICD-10-CM

## 2017-03-14 DIAGNOSIS — IMO0002 Reserved for concepts with insufficient information to code with codable children: Secondary | ICD-10-CM

## 2017-03-14 MED ORDER — LISINOPRIL 20 MG PO TABS: 20.0000 mg | ORAL_TABLET | Freq: Every day | ORAL | 0 refills | Status: DC

## 2017-03-14 MED ORDER — INSULIN DEGLUDEC 100 UNIT/ML ~~LOC~~ SOPN: 20.0000 [IU] | PEN_INJECTOR | Freq: Every day | SUBCUTANEOUS | 2 refills | Status: DC

## 2017-03-14 MED ORDER — ACCU-CHEK GUIDE W/DEVICE KIT: 1.0000 | PACK | 0 refills | Status: DC

## 2017-03-14 MED ORDER — LANCETS MISC: 1.0000 | 3 refills | Status: DC

## 2017-03-14 MED ORDER — SITAGLIPTIN PHOS-METFORMIN HCL 50-1000 MG PO TABS: ORAL_TABLET | ORAL | 0 refills | Status: DC

## 2017-03-14 MED ORDER — SIMVASTATIN 40 MG PO TABS: 40.0000 mg | ORAL_TABLET | Freq: Every evening | ORAL | 0 refills | Status: DC

## 2017-03-14 MED ORDER — INSULIN PEN NEEDLE 31G X 8 MM MISC: 1.0000 | 3 refills | Status: DC

## 2017-03-14 MED ORDER — GLUCOSE BLOOD VI STRP: ORAL_STRIP | 2 refills | Status: DC

## 2017-03-14 NOTE — Progress Notes (Signed)
Subjective:    Patient ID: Preston Mack, male    DOB: 27-Sep-1965,    Past Medical History:  Diagnosis Date  . Diabetes mellitus, type II ([place])   . Hyperlipidemia    Past Surgical History:  Procedure Laterality Date  . Lipoma removal    . Osteophyte of bone     Social History   Socioeconomic History  . Marital status: Married    Spouse name: None  . Number of children: None  . Years of education: None  . Highest education level: None  Social Needs  . Financial resource strain: None  . Food insecurity - worry: None  . Food insecurity - inability: None  . Transportation needs - medical: None  . Transportation needs - non-medical: None  Occupational History  . None  Tobacco Use  . Smoking status: Former Research scientist (life sciences)  . Smokeless tobacco: Never Used  Substance and Sexual Activity  . Alcohol use: No    Alcohol/week: 0.0 oz  . Drug use: No  . Sexual activity: None  Other Topics Concern  . None  Social History Narrative  . None   Outpatient Encounter Medications as of 03/14/2017  Medication Sig  . Blood Glucose Monitoring Suppl (ACCU-CHEK GUIDE) w/Device KIT 1 Piece by Does not apply route as directed.  Marland Kitchen glucose blood (ACCU-CHEK GUIDE) test strip Use as instructed  . glucose blood test strip Use as instructed tid  Dx: E11.65.  Marland Kitchen insulin degludec (TRESIBA FLEXTOUCH) 100 UNIT/ML SOPN FlexTouch Pen Inject 0.2 mLs (20 Units total) into the skin daily at 10 pm.  . Insulin Pen Needle (B-D ULTRAFINE III SHORT PEN) 31G X 8 MM MISC 1 each by Does not apply route as directed.  . Lancets MISC 1 each by Does not apply route as directed.  Marland Kitchen lisinopril (PRINIVIL,ZESTRIL) 20 MG tablet Take 1 tablet (20 mg total) by mouth daily.  . simvastatin (ZOCOR) 40 MG tablet Take 1 tablet (40 mg total) by mouth every evening.  . sitaGLIPtin-metformin (JANUMET) 50-1000 MG tablet TAKE (1) TABLET TWICE A DAY WITH FOOD---BREAKFAST AND SUPPER.  . [DISCONTINUED] JANUMET 50-1000 MG tablet TAKE (1)  TABLET TWICE A DAY WITH FOOD---BREAKFAST AND SUPPER.  . [DISCONTINUED] lisinopril (PRINIVIL,ZESTRIL) 20 MG tablet TAKE 1 TABLET BY MOUTH ONCE DAILY.  . [DISCONTINUED] simvastatin (ZOCOR) 40 MG tablet TAKE 1 TABLET BY MOUTH ONCE DAILY IN THE EVENING   No facility-administered encounter medications on file as of 03/14/2017.    ALLERGIES: No Known Allergies VACCINATION STATUS:  There is no immunization history on file for this patient.  Diabetes  He presents for his follow-up diabetic visit. He has type 2 diabetes mellitus. Onset time: He was diagnosed at approximate age of 54 years. His disease course has been worsening. There are no hypoglycemic associated symptoms. Pertinent negatives for hypoglycemia include no confusion, headaches, pallor or seizures. Associated symptoms include polydipsia and polyuria. Pertinent negatives for diabetes include no chest pain, no fatigue, no polyphagia and no weakness. There are no hypoglycemic complications. Symptoms are worsening. There are no diabetic complications. Risk factors for coronary artery disease include diabetes mellitus, dyslipidemia, hypertension, family history, obesity, sedentary lifestyle and tobacco exposure. Current diabetic treatment includes oral agent (dual therapy). He is compliant with treatment most of the time. His weight is decreasing steadily. He is following a diabetic diet. He has had a previous visit with a dietitian. He participates in exercise intermittently. An ACE inhibitor/angiotensin II receptor blocker is being taken. Eye exam is current.  Hyperlipidemia  This is a chronic problem. The current episode started more than 1 year ago. The problem is controlled. Exacerbating diseases include diabetes and obesity. Pertinent negatives include no chest pain, myalgias or shortness of breath. Current antihyperlipidemic treatment includes statins. Risk factors for coronary artery disease include diabetes mellitus, dyslipidemia, hypertension,  male sex, a sedentary lifestyle and obesity.  Hypertension  This is a chronic problem. The current episode started more than 1 year ago. The problem is controlled. Pertinent negatives include no chest pain, headaches, neck pain, palpitations or shortness of breath. Risk factors for coronary artery disease include dyslipidemia, diabetes mellitus, obesity and sedentary lifestyle. Past treatments include ACE inhibitors.     Review of Systems  Constitutional: Negative for fatigue and unexpected weight change.  HENT: Negative for dental problem, mouth sores and trouble swallowing.   Eyes: Negative for visual disturbance.  Respiratory: Negative for cough, choking, chest tightness, shortness of breath and wheezing.   Cardiovascular: Negative for chest pain, palpitations and leg swelling.  Gastrointestinal: Negative for abdominal distention, abdominal pain, constipation, diarrhea, nausea and vomiting.  Endocrine: Positive for polydipsia and polyuria. Negative for polyphagia.  Genitourinary: Negative for dysuria, flank pain, hematuria and urgency.  Musculoskeletal: Negative for back pain, gait problem, myalgias and neck pain.  Skin: Negative for pallor, rash and wound.  Neurological: Negative for seizures, syncope, weakness, numbness and headaches.  Psychiatric/Behavioral: Negative.  Negative for confusion and dysphoric mood.    Objective:    BP 122/78   Pulse 77   Ht 6' 2" (1.88 m)   Wt 269 lb (122 kg)   BMI 34.54 kg/m   Wt Readings from Last 3 Encounters:  03/14/17 269 lb (122 kg)  11/08/16 270 lb (122.5 kg)  07/06/16 270 lb (122.5 kg)    Physical Exam  Constitutional: He is oriented to person, place, and time. He appears well-developed and well-nourished. He is cooperative. No distress.  HENT:  Head: Normocephalic and atraumatic.  Eyes: EOM are normal.  Neck: Normal range of motion. Neck supple. No tracheal deviation present. No thyromegaly present.  Cardiovascular: Normal rate, S1  normal, S2 normal and normal heart sounds. Exam reveals no gallop.  No murmur heard. Pulses:      Dorsalis pedis pulses are 1+ on the right side, and 1+ on the left side.       Posterior tibial pulses are 1+ on the right side, and 1+ on the left side.  Pulmonary/Chest: Breath sounds normal. No respiratory distress. He has no wheezes.  Abdominal: Soft. Bowel sounds are normal. He exhibits no distension. There is no tenderness. There is no guarding and no CVA tenderness.  Musculoskeletal: He exhibits no edema.       Right shoulder: He exhibits no swelling and no deformity.  Neurological: He is alert and oriented to person, place, and time. He has normal strength and normal reflexes. No cranial nerve deficit or sensory deficit. Gait normal.  Skin: Skin is warm and dry. No rash noted. No cyanosis. Nails show no clubbing.  Psychiatric: He has a normal mood and affect. His speech is normal and behavior is normal. Judgment and thought content normal. Cognition and memory are normal.    Results for orders placed or performed in visit on 11/08/16  COMPLETE METABOLIC PANEL WITH GFR  Result Value Ref Range   Glucose, Bld 287 (H) 65 - 99 mg/dL   BUN 17 7 - 25 mg/dL   Creat 0.83 0.60 - 1.35 mg/dL   GFR, Est  Non African American 103 > OR = 60 mL/min/1.86m   GFR, Est African American 120 > OR = 60 mL/min/1.79m  BUN/Creatinine Ratio NOT APPLICABLE 6 - 22 (calc)   Sodium 141 135 - 146 mmol/L   Potassium 4.2 3.5 - 5.3 mmol/L   Chloride 106 98 - 110 mmol/L   CO2 27 20 - 32 mmol/L   Calcium 9.5 8.6 - 10.3 mg/dL   Total Protein 6.5 6.1 - 8.1 g/dL   Albumin 4.2 3.6 - 5.1 g/dL   Globulin 2.3 1.9 - 3.7 g/dL (calc)   AG Ratio 1.8 1.0 - 2.5 (calc)   Total Bilirubin 0.5 0.2 - 1.2 mg/dL   Alkaline phosphatase (APISO) 39 (L) 40 - 115 U/L   AST 18 10 - 40 U/L   ALT 25 9 - 46 U/L  Hemoglobin A1c  Result Value Ref Range   Hgb A1c MFr Bld 11.5 (H) <5.7 % of total Hgb   Mean Plasma Glucose 283 (calc)   eAG  (mmol/L) 15.7 (calc)   Complete Blood Count (Most recent): Lab Results  Component Value Date   HGB 16.7 04/22/2008   HCT 49.0 04/22/2008   Chemistry (most recent): Lab Results  Component Value Date   NA 141 03/08/2017   K 4.2 03/08/2017   CL 106 03/08/2017   CO2 27 03/08/2017   BUN 17 03/08/2017   CREATININE 0.83 03/08/2017   Diabetic Labs (most recent): Lab Results  Component Value Date   HGBA1C 11.5 (H) 03/08/2017   HGBA1C 7.4 (H) 11/01/2016   HGBA1C 7.7 (H) 06/29/2016   Lipid Panel     Component Value Date/Time   CHOL 122 02/12/2016 0834   TRIG 51 02/12/2016 0834   HDL 31 (L) 02/12/2016 0834   CHOLHDL 3.9 02/12/2016 0834   VLDL 10 02/12/2016 0834   LDLCALC 81 02/12/2016 0834     Assessment & Plan:   1. Uncontrolled type 2 diabetes mellitus with complication, without long-term current use of insulin ([facility]- patient remains at a high risk for more acute and chronic complications of diabetes which include CAD, CVA, CKD, retinopathy, and neuropathy. These are all discussed in detail with the patient.  Patient came with loss of control of diabetes with A1c of 11.5% increasing from 7.4%.     Recent labs reviewed.   - I have re-counseled the patient on diet management and weight loss  by adopting a carbohydrate restricted / protein rich  Diet.  -  Suggestion is made for him to avoid simple carbohydrates  from his diet including Cakes, Sweet Desserts / Pastries, Ice Cream, Soda (diet and regular), Sweet Tea, Candies, Chips, Cookies, Store Bought Juices, Alcohol in Excess of  1-2 drinks a day, Artificial Sweeteners, and "Sugar-free" Products. This will help patient to have stable blood glucose profile and potentially avoid unintended weight gain.   - Patient is advised to stick to a routine mealtimes to eat 3 meals  a day and avoid unnecessary snacks ( to snack only to correct hypoglycemia).  -Given his current glycemic burden with A1c of 11.5%, he is approached for  insulin treatment and he accepts. -Discussed and initiated basal insulin with Tresiba 20 units nightly associated with strict monitoring of blood glucose 4 times a day before meals and at bedtime and return in 2 weeks for reevaluation. -I will continue Janumet 50/1000mg po BID.  - Patient specific target  for A1c; LDL, HDL, Triglycerides, and  Waist Circumference were discussed in detail.  2)  BP/HTN: His blood pressure is controlled to target. Continue current medications including ACEI/ARB. 3) Lipids/HPL: His lipid panel is controlled with LDL of 81, I have advised him to continue simvastatin 40 mg p.o. nightly.   4)  Weight/Diet: CDE consult in progress, exercise, and carbohydrates information provided.  5) Chronic Care/Health Maintenance:  -Patient is on ACEI/ARB and Statin medications and encouraged to continue to follow up with Ophthalmology, Podiatrist at least yearly or according to recommendations, and advised to  stay away from smoking. I have recommended yearly flu vaccine and pneumonia vaccination at least every 5 years; moderate intensity exercise for up to 150 minutes weekly; and  sleep for at least 7 hours a day.  I advised patient to maintain close follow up with his PCP for primary care needs.  - Time spent with the patient: 25 min, of which >50% was spent in reviewing his  current and  previous labs, previous treatments, and medications  doses and developing a plan for long-term care.     Follow up plan: Return in about 2 weeks (around 03/28/2017) for follow up with meter and logs- no labs.  Glade Lloyd, MD Phone: [phone]  Fax: [phone]  This note was partially dictated with voice recognition software. Similar sounding words can be transcribed inadequately or may not  be corrected upon review.  03/14/2017, 9:21 PM

## 2017-03-14 NOTE — Patient Instructions (Signed)

## 2017-03-29 ENCOUNTER — Ambulatory Visit: Payer: BC Managed Care – PPO | Admitting: "Endocrinology

## 2017-03-29 ENCOUNTER — Encounter: Payer: Self-pay | Admitting: "Endocrinology

## 2017-03-29 VITALS — BP 131/82 | HR 66 | Ht 74.0 in | Wt 269.0 lb

## 2017-03-29 DIAGNOSIS — E1165 Type 2 diabetes mellitus with hyperglycemia: Secondary | ICD-10-CM

## 2017-03-29 DIAGNOSIS — E782 Mixed hyperlipidemia: Secondary | ICD-10-CM

## 2017-03-29 DIAGNOSIS — E118 Type 2 diabetes mellitus with unspecified complications: Secondary | ICD-10-CM

## 2017-03-29 DIAGNOSIS — Z6834 Body mass index (BMI) 34.0-34.9, adult: Secondary | ICD-10-CM | POA: Diagnosis not present

## 2017-03-29 DIAGNOSIS — IMO0002 Reserved for concepts with insufficient information to code with codable children: Secondary | ICD-10-CM

## 2017-03-29 DIAGNOSIS — I10 Essential (primary) hypertension: Secondary | ICD-10-CM | POA: Diagnosis not present

## 2017-03-29 DIAGNOSIS — E6609 Other obesity due to excess calories: Secondary | ICD-10-CM | POA: Diagnosis not present

## 2017-03-29 NOTE — Progress Notes (Signed)
Subjective:    Patient ID: Preston Mack, male    DOB: 1965/05/31,    Past Medical History:  Diagnosis Date  . Diabetes mellitus, type II ([place])   . Hyperlipidemia    Past Surgical History:  Procedure Laterality Date  . Lipoma removal    . Osteophyte of bone     Social History   Socioeconomic History  . Marital status: Married    Spouse name: None  . Number of children: None  . Years of education: None  . Highest education level: None  Social Needs  . Financial resource strain: None  . Food insecurity - worry: None  . Food insecurity - inability: None  . Transportation needs - medical: None  . Transportation needs - non-medical: None  Occupational History  . None  Tobacco Use  . Smoking status: Former Research scientist (life sciences)  . Smokeless tobacco: Never Used  Substance and Sexual Activity  . Alcohol use: No    Alcohol/week: 0.0 oz  . Drug use: No  . Sexual activity: None  Other Topics Concern  . None  Social History Narrative  . None   Outpatient Encounter Medications as of 03/29/2017  Medication Sig  . Blood Glucose Monitoring Suppl (ACCU-CHEK GUIDE) w/Device KIT 1 Piece by Does not apply route as directed.  . insulin degludec (TRESIBA FLEXTOUCH) 100 UNIT/ML SOPN FlexTouch Pen Inject 0.2 mLs (20 Units total) into the skin daily at 10 pm. (Patient not taking: Reported on 03/29/2017)  . lisinopril (PRINIVIL,ZESTRIL) 20 MG tablet Take 1 tablet (20 mg total) by mouth daily.  . simvastatin (ZOCOR) 40 MG tablet Take 1 tablet (40 mg total) by mouth every evening.  . sitaGLIPtin-metformin (JANUMET) 50-1000 MG tablet TAKE (1) TABLET TWICE A DAY WITH FOOD---BREAKFAST AND SUPPER.  . [DISCONTINUED] glucose blood (ACCU-CHEK GUIDE) test strip Use as instructed  . [DISCONTINUED] glucose blood test strip Use as instructed tid  Dx: E11.65.  . [DISCONTINUED] Insulin Pen Needle (B-D ULTRAFINE III SHORT PEN) 31G X 8 MM MISC 1 each by Does not apply route as directed.  . [DISCONTINUED] Lancets  MISC 1 each by Does not apply route as directed.   No facility-administered encounter medications on file as of 03/29/2017.    ALLERGIES: No Known Allergies VACCINATION STATUS:  There is no immunization history on file for this patient.  Diabetes  He presents for his follow-up diabetic visit. He has type 2 diabetes mellitus. Onset time: He was diagnosed at approximate age of 89 years. His disease course has been improving. There are no hypoglycemic associated symptoms. Pertinent negatives for hypoglycemia include no confusion, headaches, pallor or seizures. Associated symptoms include polydipsia and polyuria. Pertinent negatives for diabetes include no chest pain, no fatigue, no polyphagia and no weakness. There are no hypoglycemic complications. Symptoms are improving. There are no diabetic complications. Risk factors for coronary artery disease include diabetes mellitus, dyslipidemia, hypertension, family history, obesity, sedentary lifestyle, tobacco exposure and male sex. Current diabetic treatment includes oral agent (dual therapy). He is compliant with treatment most of the time. His weight is stable. He is following a generally unhealthy diet. He has had a previous visit with a dietitian. He participates in exercise intermittently. His breakfast blood glucose range is generally 140-180 mg/dl. His lunch blood glucose range is generally 140-180 mg/dl. His dinner blood glucose range is generally 140-180 mg/dl. His bedtime blood glucose range is generally 140-180 mg/dl. His overall blood glucose range is 140-180 mg/dl. An ACE inhibitor/angiotensin II receptor blocker is  being taken. Eye exam is current.  Hyperlipidemia  This is a chronic problem. The current episode started more than 1 year ago. The problem is controlled. Exacerbating diseases include diabetes and obesity. Pertinent negatives include no chest pain, myalgias or shortness of breath. Current antihyperlipidemic treatment includes statins.  Risk factors for coronary artery disease include diabetes mellitus, dyslipidemia, hypertension, male sex, a sedentary lifestyle and obesity.  Hypertension  This is a chronic problem. The current episode started more than 1 year ago. The problem is controlled. Pertinent negatives include no chest pain, headaches, neck pain, palpitations or shortness of breath. Risk factors for coronary artery disease include dyslipidemia, diabetes mellitus, obesity, sedentary lifestyle and male gender. Past treatments include ACE inhibitors.     Review of Systems  Constitutional: Negative for fatigue and unexpected weight change.  HENT: Negative for dental problem, mouth sores and trouble swallowing.   Eyes: Negative for visual disturbance.  Respiratory: Negative for cough, choking, chest tightness, shortness of breath and wheezing.   Cardiovascular: Negative for chest pain, palpitations and leg swelling.  Gastrointestinal: Negative for abdominal distention, abdominal pain, constipation, diarrhea, nausea and vomiting.  Endocrine: Positive for polydipsia and polyuria. Negative for polyphagia.  Genitourinary: Negative for dysuria, flank pain, hematuria and urgency.  Musculoskeletal: Negative for back pain, gait problem, myalgias and neck pain.  Skin: Negative for pallor, rash and wound.  Neurological: Negative for seizures, syncope, weakness, numbness and headaches.  Psychiatric/Behavioral: Negative.  Negative for confusion and dysphoric mood.    Objective:    BP 131/82   Pulse 66   Ht '6\' 2"'$  (1.88 m)   Wt 269 lb (122 kg)   BMI 34.54 kg/m   Wt Readings from Last 3 Encounters:  03/29/17 269 lb (122 kg)  03/14/17 269 lb (122 kg)  11/08/16 270 lb (122.5 kg)    Physical Exam  Constitutional: He is oriented to person, place, and time. He appears well-developed and well-nourished. He is cooperative. No distress.  HENT:  Head: Normocephalic and atraumatic.  Eyes: EOM are normal.  Neck: Normal range of  motion. Neck supple. No tracheal deviation present. No thyromegaly present.  Cardiovascular: Normal rate, S1 normal, S2 normal and normal heart sounds. Exam reveals no gallop.  No murmur heard. Pulses:      Dorsalis pedis pulses are 1+ on the right side, and 1+ on the left side.       Posterior tibial pulses are 1+ on the right side, and 1+ on the left side.  Pulmonary/Chest: Breath sounds normal. No respiratory distress. He has no wheezes.  Abdominal: Soft. Bowel sounds are normal. He exhibits no distension. There is no tenderness. There is no guarding and no CVA tenderness.  Musculoskeletal: He exhibits no edema.       Right shoulder: He exhibits no swelling and no deformity.  Neurological: He is alert and oriented to person, place, and time. He has normal strength and normal reflexes. No cranial nerve deficit or sensory deficit. Gait normal.  Skin: Skin is warm and dry. No rash noted. No cyanosis. Nails show no clubbing.  Psychiatric: He has a normal mood and affect. His speech is normal and behavior is normal. Judgment and thought content normal. Cognition and memory are normal.    Results for orders placed or performed in visit on 11/08/16  COMPLETE METABOLIC PANEL WITH GFR  Result Value Ref Range   Glucose, Bld 287 (H) 65 - 99 mg/dL   BUN 17 7 - 25 mg/dL   Creat  0.83 0.60 - 1.35 mg/dL   GFR, Est Non African American 103 > OR = 60 mL/min/1.80m   GFR, Est African American 120 > OR = 60 mL/min/1.722m  BUN/Creatinine Ratio NOT APPLICABLE 6 - 22 (calc)   Sodium 141 135 - 146 mmol/L   Potassium 4.2 3.5 - 5.3 mmol/L   Chloride 106 98 - 110 mmol/L   CO2 27 20 - 32 mmol/L   Calcium 9.5 8.6 - 10.3 mg/dL   Total Protein 6.5 6.1 - 8.1 g/dL   Albumin 4.2 3.6 - 5.1 g/dL   Globulin 2.3 1.9 - 3.7 g/dL (calc)   AG Ratio 1.8 1.0 - 2.5 (calc)   Total Bilirubin 0.5 0.2 - 1.2 mg/dL   Alkaline phosphatase (APISO) 39 (L) 40 - 115 U/L   AST 18 10 - 40 U/L   ALT 25 9 - 46 U/L  Hemoglobin A1c   Result Value Ref Range   Hgb A1c MFr Bld 11.5 (H) <5.7 % of total Hgb   Mean Plasma Glucose 283 (calc)   eAG (mmol/L) 15.7 (calc)   Complete Blood Count (Most recent): Lab Results  Component Value Date   HGB 16.7 04/22/2008   HCT 49.0 04/22/2008   Chemistry (most recent): Lab Results  Component Value Date   NA 141 03/08/2017   K 4.2 03/08/2017   CL 106 03/08/2017   CO2 27 03/08/2017   BUN 17 03/08/2017   CREATININE 0.83 03/08/2017   Diabetic Labs (most recent): Lab Results  Component Value Date   HGBA1C 11.5 (H) 03/08/2017   HGBA1C 7.4 (H) 11/01/2016   HGBA1C 7.7 (H) 06/29/2016   Lipid Panel     Component Value Date/Time   CHOL 122 02/12/2016 0834   TRIG 51 02/12/2016 0834   HDL 31 (L) 02/12/2016 0834   CHOLHDL 3.9 02/12/2016 0834   VLDL 10 02/12/2016 0834   LDLCALC 81 02/12/2016 0834     Assessment & Plan:   1. Uncontrolled type 2 diabetes mellitus with complication, without long-term current use of insulin ([place]- patient remains at a high risk for more acute and chronic complications of diabetes which include CAD, CVA, CKD, retinopathy, and neuropathy. These are all discussed in detail with the patient.  Patient recently came with loss of control of diabetes with A1c of 11.5% increasing from 7.4%.  He was initiated on Tresiba 20 units nightly, after he took it for a week, he decided to stop it with no clear reasons.  No documented or reported hypoglycemia.    Recent labs reviewed.   - I have re-counseled the patient on diet management and weight loss  by adopting a carbohydrate restricted / protein rich  Diet.  -  Suggestion is made for him to avoid simple carbohydrates  from his diet including Cakes, Sweet Desserts / Pastries, Ice Cream, Soda (diet and regular), Sweet Tea, Candies, Chips, Cookies, Store Bought Juices, Alcohol in Excess of  1-2 drinks a day, Artificial Sweeteners, and "Sugar-free" Products. This will help patient to have stable blood glucose  profile and potentially avoid unintended weight gain.  - Patient is advised to stick to a routine mealtimes to eat 3 meals  a day and avoid unnecessary snacks ( to snack only to correct hypoglycemia).  -Given his current glycemic burden with recent A1c of 11.5%, he is approached to stay on his basal insulin, and he reluctantly accepts.  -Good development in his case in the fact that he has responded to a low-dose of  basal insulin with improvement of his glycemia across the day towards target. .  -We will continue monitoring of blood glucose 4 times a day before meals and at bedtime and return in 9 weeks for reevaluation with his meter and logs for reevaluation. -He is encouraged to call clinic if he registers less than 70 or greater than 200 x 3. -I will continue Janumet 50/1000mg po BID.  - Patient specific target  for A1c; LDL, HDL, Triglycerides, and  Waist Circumference were discussed in detail.  2) BP/HTN: His blood pressure is controlled to target. Continue current medications including ACEI/ARB. 3) Lipids/HPL: His lipid panel is controlled with LDL of 81, I have advised him to continue simvastatin 40 mg p.o. nightly.    4)  Weight/Diet: CDE consult in progress, exercise, and carbohydrates information provided.  5) Chronic Care/Health Maintenance:  -Patient is on ACEI/ARB and Statin medications and encouraged to continue to follow up with Ophthalmology, Podiatrist at least yearly or according to recommendations, and advised to  stay away from smoking. I have recommended yearly flu vaccine and pneumonia vaccination at least every 5 years; moderate intensity exercise for up to 150 minutes weekly; and  sleep for at least 7 hours a day.  I advised patient to maintain close follow up with his PCP for primary care needs.  - Time spent with the patient: 25 min, of which >50% was spent in reviewing his blood glucose logs , discussing his hypo- and hyper-glycemic episodes, reviewing his current  and  previous labs and insulin doses and developing a plan to avoid hypo- and hyper-glycemia. Please refer to Patient Instructions for Blood Glucose Monitoring and Insulin/Medications Dosing Guide"  in media tab for additional information.   Follow up plan: Return in about 9 weeks (around 05/31/2017) for meter, and logs, follow up with pre-visit labs, meter, and logs.  Glade Lloyd, MD Phone: [phone]  Fax: [phone]  This note was partially dictated with voice recognition software. Similar sounding words can be transcribed inadequately or may not  be corrected upon review.  03/29/2017, 4:22 PM

## 2017-03-29 NOTE — Patient Instructions (Signed)

## 2017-05-24 ENCOUNTER — Other Ambulatory Visit: Payer: Self-pay | Admitting: "Endocrinology

## 2017-05-24 LAB — COMPLETE METABOLIC PANEL WITH GFR
AG RATIO: 2.3 (calc) (ref 1.0–2.5)
ALT: 21 U/L (ref 9–46)
AST: 19 U/L (ref 10–40)
Albumin: 4.4 g/dL (ref 3.6–5.1)
Alkaline phosphatase (APISO): 35 U/L — ABNORMAL LOW (ref 40–115)
BUN: 22 mg/dL (ref 7–25)
CALCIUM: 9.4 mg/dL (ref 8.6–10.3)
CO2: 26 mmol/L (ref 20–32)
CREATININE: 0.74 mg/dL (ref 0.60–1.35)
Chloride: 108 mmol/L (ref 98–110)
GFR, EST NON AFRICAN AMERICAN: 108 mL/min/{1.73_m2} (ref >=60)
GFR, Est African American: 126 mL/min/{1.73_m2} (ref >=60)
GLUCOSE: 122 mg/dL — AB (ref 65–99)
Globulin: 1.9 g/dL (calc) (ref 1.9–3.7)
POTASSIUM: 4.2 mmol/L (ref 3.5–5.3)
Sodium: 141 mmol/L (ref 135–146)
Total Bilirubin: 0.4 mg/dL (ref 0.2–1.2)
Total Protein: 6.3 g/dL (ref 6.1–8.1)

## 2017-05-24 LAB — HEMOGLOBIN A1C
HEMOGLOBIN A1C: 6.8 %{Hb} — AB (ref <5.7)
Mean Plasma Glucose: 148 (calc)
eAG (mmol/L): 8.2 (calc)

## 2017-06-07 ENCOUNTER — Encounter: Payer: Self-pay | Admitting: "Endocrinology

## 2017-06-07 ENCOUNTER — Ambulatory Visit: Payer: BC Managed Care – PPO | Admitting: "Endocrinology

## 2017-06-07 VITALS — BP 112/69 | HR 75 | Ht 74.0 in | Wt 268.0 lb

## 2017-06-07 DIAGNOSIS — IMO0002 Reserved for concepts with insufficient information to code with codable children: Secondary | ICD-10-CM

## 2017-06-07 DIAGNOSIS — E1165 Type 2 diabetes mellitus with hyperglycemia: Secondary | ICD-10-CM

## 2017-06-07 DIAGNOSIS — E118 Type 2 diabetes mellitus with unspecified complications: Secondary | ICD-10-CM | POA: Diagnosis not present

## 2017-06-07 DIAGNOSIS — E782 Mixed hyperlipidemia: Secondary | ICD-10-CM

## 2017-06-07 DIAGNOSIS — I10 Essential (primary) hypertension: Secondary | ICD-10-CM | POA: Diagnosis not present

## 2017-06-07 MED ORDER — INSULIN DEGLUDEC 100 UNIT/ML ~~LOC~~ SOPN: 15.0000 [IU] | PEN_INJECTOR | Freq: Every day | SUBCUTANEOUS | 2 refills | Status: DC

## 2017-06-07 MED ORDER — INSULIN PEN NEEDLE 31G X 8 MM MISC: 1.0000 | 3 refills | Status: DC

## 2017-06-07 NOTE — Progress Notes (Signed)
Subjective:    Patient ID: Preston Mack, male    DOB: [birth year],    Past Medical History:  Diagnosis Date  . Diabetes mellitus, type II ([place])   . Hyperlipidemia    Past Surgical History:  Procedure Laterality Date  . Lipoma removal    . Osteophyte of bone     Social History   Socioeconomic History  . Marital status: Married    Spouse name: Not on file  . Number of children: Not on file  . Years of education: Not on file  . Highest education level: Not on file  Occupational History  . Not on file  Social Needs  . Financial resource strain: Not on file  . Food insecurity:    Worry: Not on file    Inability: Not on file  . Transportation needs:    Medical: Not on file    Non-medical: Not on file  Tobacco Use  . Smoking status: Former Research scientist (life sciences)  . Smokeless tobacco: Never Used  Substance and Sexual Activity  . Alcohol use: No    Alcohol/week: 0.0 oz  . Drug use: No  . Sexual activity: Not on file  Lifestyle  . Physical activity:    Days per week: Not on file    Minutes per session: Not on file  . Stress: Not on file  Relationships  . Social connections:    Talks on phone: Not on file    Gets together: Not on file    Attends religious service: Not on file    Active member of club or organization: Not on file    Attends meetings of clubs or organizations: Not on file    Relationship status: Not on file  Other Topics Concern  . Not on file  Social History Narrative  . Not on file   Outpatient Encounter Medications as of 06/07/2017  Medication Sig  . Blood Glucose Monitoring Suppl (ACCU-CHEK GUIDE) w/Device KIT 1 Piece by Does not apply route as directed.  . insulin degludec (TRESIBA FLEXTOUCH) 100 UNIT/ML SOPN FlexTouch Pen Inject 0.15 mLs (15 Units total) into the skin daily at 10 pm.  . Insulin Pen Needle (B-D ULTRAFINE III SHORT PEN) 31G X 8 MM MISC 1 each by Does not apply route as directed.  Marland Kitchen lisinopril (PRINIVIL,ZESTRIL) 20 MG tablet Take 1 tablet  (20 mg total) by mouth daily.  . simvastatin (ZOCOR) 40 MG tablet Take 1 tablet (40 mg total) by mouth every evening.  . sitaGLIPtin-metformin (JANUMET) 50-1000 MG tablet TAKE (1) TABLET TWICE A DAY WITH FOOD---BREAKFAST AND SUPPER.  . [DISCONTINUED] TRESIBA FLEXTOUCH 100 UNIT/ML SOPN FlexTouch Pen Inject 0.2 mLs (20 Units total) into the skin daily at 10 pm.   No facility-administered encounter medications on file as of 06/07/2017.    ALLERGIES: No Known Allergies VACCINATION STATUS:  There is no immunization history on file for this patient.  Diabetes  He presents for his follow-up diabetic visit. He has type 2 diabetes mellitus. Onset time: He was diagnosed at approximate age of 65 years. His disease course has been improving. There are no hypoglycemic associated symptoms. Pertinent negatives for hypoglycemia include no confusion, headaches, pallor or seizures. Pertinent negatives for diabetes include no chest pain, no fatigue, no polydipsia, no polyphagia, no polyuria and no weakness. There are no hypoglycemic complications. Symptoms are improving. There are no diabetic complications. Risk factors for coronary artery disease include diabetes mellitus, dyslipidemia, hypertension, family history, obesity, sedentary lifestyle, tobacco exposure and male  sex. Current diabetic treatment includes oral agent (dual therapy). He is compliant with treatment most of the time. His weight is stable. He is following a generally unhealthy diet. He has had a previous visit with a dietitian. He participates in exercise intermittently. His breakfast blood glucose range is generally 130-140 mg/dl. His bedtime blood glucose range is generally 130-140 mg/dl. His overall blood glucose range is 130-140 mg/dl. An ACE inhibitor/angiotensin II receptor blocker is being taken. Eye exam is current.  Hyperlipidemia  This is a chronic problem. The current episode started more than 1 year ago. The problem is controlled.  Exacerbating diseases include diabetes and obesity. Pertinent negatives include no chest pain, myalgias or shortness of breath. Current antihyperlipidemic treatment includes statins. Risk factors for coronary artery disease include diabetes mellitus, dyslipidemia, hypertension, male sex, a sedentary lifestyle and obesity.  Hypertension  This is a chronic problem. The current episode started more than 1 year ago. The problem is controlled. Pertinent negatives include no chest pain, headaches, neck pain, palpitations or shortness of breath. Risk factors for coronary artery disease include dyslipidemia, diabetes mellitus, obesity, sedentary lifestyle and male gender. Past treatments include ACE inhibitors.     Review of Systems  Constitutional: Negative for fatigue and unexpected weight change.  HENT: Negative for dental problem, mouth sores and trouble swallowing.   Eyes: Negative for visual disturbance.  Respiratory: Negative for cough, choking, chest tightness, shortness of breath and wheezing.   Cardiovascular: Negative for chest pain, palpitations and leg swelling.  Gastrointestinal: Negative for abdominal distention, abdominal pain, constipation, diarrhea, nausea and vomiting.  Endocrine: Negative for polydipsia, polyphagia and polyuria.  Genitourinary: Negative for dysuria, flank pain, hematuria and urgency.  Musculoskeletal: Negative for back pain, gait problem, myalgias and neck pain.  Skin: Negative for pallor, rash and wound.  Neurological: Negative for seizures, syncope, weakness, numbness and headaches.  Psychiatric/Behavioral: Negative.  Negative for confusion and dysphoric mood.    Objective:    BP 112/69   Pulse 75   Ht 6' 2" (1.88 m)   Wt 268 lb (121.6 kg)   BMI 34.41 kg/m   Wt Readings from Last 3 Encounters:  06/07/17 268 lb (121.6 kg)  03/29/17 269 lb (122 kg)  03/14/17 269 lb (122 kg)    Physical Exam  Constitutional: He is oriented to person, place, and time. He  appears well-developed. He is cooperative. No distress.  HENT:  Head: Normocephalic and atraumatic.  Eyes: EOM are normal.  Neck: Normal range of motion. Neck supple. No tracheal deviation present. No thyromegaly present.  Cardiovascular: Normal rate, S1 normal and S2 normal. Exam reveals no gallop.  No murmur heard. Pulses:      Dorsalis pedis pulses are 1+ on the right side, and 1+ on the left side.       Posterior tibial pulses are 1+ on the right side, and 1+ on the left side.  Pulmonary/Chest: Effort normal. No respiratory distress. He has no wheezes.  Abdominal: He exhibits no distension. There is no tenderness. There is no guarding and no CVA tenderness.  Musculoskeletal: He exhibits no edema.       Right shoulder: He exhibits no swelling and no deformity.  Neurological: He is alert and oriented to person, place, and time. He has normal strength and normal reflexes. No cranial nerve deficit or sensory deficit. Gait normal.  Skin: Skin is warm and dry. No rash noted. No cyanosis. Nails show no clubbing.  Psychiatric: He has a normal mood and affect.  His speech is normal. Judgment normal. Cognition and memory are normal.    Results for orders placed or performed in visit on 03/29/17  COMPLETE METABOLIC PANEL WITH GFR  Result Value Ref Range   Glucose, Bld 122 (H) 65 - 99 mg/dL   BUN 22 7 - 25 mg/dL   Creat 0.74 0.60 - 1.35 mg/dL   GFR, Est Non African American 108 > OR = 60 mL/min/1.71m   GFR, Est African American 126 > OR = 60 mL/min/1.741m  BUN/Creatinine Ratio NOT APPLICABLE 6 - 22 (calc)   Sodium 141 135 - 146 mmol/L   Potassium 4.2 3.5 - 5.3 mmol/L   Chloride 108 98 - 110 mmol/L   CO2 26 20 - 32 mmol/L   Calcium 9.4 8.6 - 10.3 mg/dL   Total Protein 6.3 6.1 - 8.1 g/dL   Albumin 4.4 3.6 - 5.1 g/dL   Globulin 1.9 1.9 - 3.7 g/dL (calc)   AG Ratio 2.3 1.0 - 2.5 (calc)   Total Bilirubin 0.4 0.2 - 1.2 mg/dL   Alkaline phosphatase (APISO) 35 (L) 40 - 115 U/L   AST 19 10 -  40 U/L   ALT 21 9 - 46 U/L  Hemoglobin A1c  Result Value Ref Range   Hgb A1c MFr Bld 6.8 (H) <5.7 % of total Hgb   Mean Plasma Glucose 148 (calc)   eAG (mmol/L) 8.2 (calc)   Complete Blood Count (Most recent): Lab Results  Component Value Date   HGB 16.7 04/22/2008   HCT 49.0 04/22/2008   Chemistry (most recent): Lab Results  Component Value Date   NA 141 05/23/2017   K 4.2 05/23/2017   CL 108 05/23/2017   CO2 26 05/23/2017   BUN 22 05/23/2017   CREATININE 0.74 05/23/2017   Diabetic Labs (most recent): Lab Results  Component Value Date   HGBA1C 6.8 (H) 05/23/2017   HGBA1C 11.5 (H) 03/08/2017   HGBA1C 7.4 (H) 11/01/2016   Lipid Panel     Component Value Date/Time   CHOL 122 02/12/2016 0834   TRIG 51 02/12/2016 0834   HDL 31 (L) 02/12/2016 0834   CHOLHDL 3.9 02/12/2016 0834   VLDL 10 02/12/2016 0834   LDLCALC 81 02/12/2016 0834     Assessment & Plan:   1. Uncontrolled type 2 diabetes mellitus with complication, without long-term current use of insulin ([place]- patient remains at a high risk for more acute and chronic complications of diabetes which include CAD, CVA, CKD, retinopathy, and neuropathy. These are all discussed in detail with the patient.  Patient returns with significant improvement in his glucose profile and improved a1c of 6.8% from 11.5%. He remains on Tresiba 20 units nightly.  No documented or reported hypoglycemia.    Recent labs reviewed.   - I have re-counseled the patient on diet management and weight loss  by adopting a carbohydrate restricted / protein rich  Diet.  -  Suggestion is made for him to avoid simple carbohydrates  from his diet including Cakes, Sweet Desserts / Pastries, Ice Cream, Soda (diet and regular), Sweet Tea, Candies, Chips, Cookies, Store Bought Juices, Alcohol in Excess of  1-2 drinks a day, Artificial Sweeteners, and "Sugar-free" Products. This will help patient to have stable blood glucose profile and potentially  avoid unintended weight gain.   - Patient is advised to stick to a routine mealtimes to eat 3 meals  a day and avoid unnecessary snacks ( to snack only to correct hypoglycemia).  -  He is  approached to stay on his basal insulin, and he reluctantly accepts. I will lower his Tyler Aas to 15 units qhs, associated with monitoring of glucose 2 times a day - before breakfast and at bed time.  -He is encouraged to call clinic if he registers less than 70 or greater than 200 x 3. -I will continue Janumet 50/1000mg po BID.  - Patient specific target  for A1c; LDL, HDL, Triglycerides, and  Waist Circumference were discussed in detail.  2) BP/HTN: His blood pressure is controlled to target. Continue current medications including ACEI/ARB. 3) Lipids/HPL: His lipid panel is controlled with LDL of 81, I have advised him to continue simvastatin 40 mg p.o. nightly.    4)  Weight/Diet: CDE consult in progress, exercise, and carbohydrates information provided.  5) Chronic Care/Health Maintenance:  -Patient is on ACEI/ARB and Statin medications and encouraged to continue to follow up with Ophthalmology, Podiatrist at least yearly or according to recommendations, and advised to  stay away from smoking. I have recommended yearly flu vaccine and pneumonia vaccination at least every 5 years; moderate intensity exercise for up to 150 minutes weekly; and  sleep for at least 7 hours a day.  I advised patient to maintain close follow up with his PCP for primary care needs.  - Time spent with the patient: 25 min, of which >50% was spent in reviewing his blood glucose logs , discussing his hypo- and hyper-glycemic episodes, reviewing his current and  previous labs and insulin doses and developing a plan to avoid hypo- and hyper-glycemia. Please refer to Patient Instructions for Blood Glucose Monitoring and Insulin/Medications Dosing Guide"  in media tab for additional information. Preston Mack participated in the  discussions, expressed understanding, and voiced agreement with the above plans.  All questions were answered to his satisfaction. he is encouraged to contact clinic should he have any questions or concerns prior to his return visit.  Follow up plan: Return in about 4 months (around 10/08/2017) for meter, and logs.  Glade Lloyd, MD Phone: [phone]  Fax: [phone]  This note was partially dictated with voice recognition software. Similar sounding words can be transcribed inadequately or may not  be corrected upon review.  06/07/2017, 5:12 PM

## 2017-06-07 NOTE — Patient Instructions (Signed)

## 2017-07-09 ENCOUNTER — Other Ambulatory Visit: Payer: Self-pay | Admitting: "Endocrinology

## 2017-10-11 ENCOUNTER — Ambulatory Visit: Payer: BC Managed Care – PPO | Admitting: "Endocrinology

## 2017-10-16 ENCOUNTER — Other Ambulatory Visit: Payer: Self-pay | Admitting: "Endocrinology

## 2017-10-23 ENCOUNTER — Other Ambulatory Visit: Payer: Self-pay | Admitting: "Endocrinology

## 2017-11-03 LAB — COMPLETE METABOLIC PANEL WITH GFR
AG Ratio: 2.3 (calc) (ref 1.0–2.5)
ALBUMIN MSPROF: 4.5 g/dL (ref 3.6–5.1)
ALT: 18 U/L (ref 9–46)
AST: 18 U/L (ref 10–35)
Alkaline phosphatase (APISO): 35 U/L — ABNORMAL LOW (ref 40–115)
BUN: 21 mg/dL (ref 7–25)
CO2: 26 mmol/L (ref 20–32)
CREATININE: 0.82 mg/dL (ref 0.70–1.33)
Calcium: 9.3 mg/dL (ref 8.6–10.3)
Chloride: 107 mmol/L (ref 98–110)
GFR, Est African American: 120 mL/min/{1.73_m2} (ref >=60)
GFR, Est Non African American: 103 mL/min/{1.73_m2} (ref >=60)
GLOBULIN: 2 g/dL (ref 1.9–3.7)
GLUCOSE: 134 mg/dL — AB (ref 65–99)
Potassium: 4.4 mmol/L (ref 3.5–5.3)
SODIUM: 141 mmol/L (ref 135–146)
TOTAL PROTEIN: 6.5 g/dL (ref 6.1–8.1)
Total Bilirubin: 0.3 mg/dL (ref 0.2–1.2)

## 2017-11-03 LAB — HEMOGLOBIN A1C
EAG (MMOL/L): 7.1 (calc)
HEMOGLOBIN A1C: 6.1 %{Hb} — AB (ref <5.7)
MEAN PLASMA GLUCOSE: 128 (calc)

## 2017-11-12 ENCOUNTER — Encounter: Payer: Self-pay | Admitting: "Endocrinology

## 2017-11-12 ENCOUNTER — Ambulatory Visit: Payer: BC Managed Care – PPO | Admitting: "Endocrinology

## 2017-11-12 VITALS — BP 125/76 | HR 59 | Ht 74.0 in | Wt 273.0 lb

## 2017-11-12 DIAGNOSIS — E1165 Type 2 diabetes mellitus with hyperglycemia: Secondary | ICD-10-CM

## 2017-11-12 DIAGNOSIS — E66811 Obesity, class 1: Secondary | ICD-10-CM

## 2017-11-12 DIAGNOSIS — Z6834 Body mass index (BMI) 34.0-34.9, adult: Secondary | ICD-10-CM

## 2017-11-12 DIAGNOSIS — IMO0002 Reserved for concepts with insufficient information to code with codable children: Secondary | ICD-10-CM

## 2017-11-12 DIAGNOSIS — E782 Mixed hyperlipidemia: Secondary | ICD-10-CM

## 2017-11-12 DIAGNOSIS — E118 Type 2 diabetes mellitus with unspecified complications: Secondary | ICD-10-CM

## 2017-11-12 DIAGNOSIS — E6609 Other obesity due to excess calories: Secondary | ICD-10-CM | POA: Diagnosis not present

## 2017-11-12 DIAGNOSIS — I10 Essential (primary) hypertension: Secondary | ICD-10-CM

## 2017-11-12 NOTE — Progress Notes (Signed)
Endocrinology follow-up note   Subjective:    Patient ID: Preston Mack, male    DOB: November 09, 1965,    Past Medical History:  Diagnosis Date  . Diabetes mellitus, type II ([place])   . Hyperlipidemia    Past Surgical History:  Procedure Laterality Date  . Lipoma removal    . Osteophyte of bone     Social History   Socioeconomic History  . Marital status: Married    Spouse name: Not on file  . Number of children: Not on file  . Years of education: Not on file  . Highest education level: Not on file  Occupational History  . Not on file  Social Needs  . Financial resource strain: Not on file  . Food insecurity:    Worry: Not on file    Inability: Not on file  . Transportation needs:    Medical: Not on file    Non-medical: Not on file  Tobacco Use  . Smoking status: Former Research scientist (life sciences)  . Smokeless tobacco: Never Used  Substance and Sexual Activity  . Alcohol use: No    Alcohol/week: 0.0 standard drinks  . Drug use: No  . Sexual activity: Not on file  Lifestyle  . Physical activity:    Days per week: Not on file    Minutes per session: Not on file  . Stress: Not on file  Relationships  . Social connections:    Talks on phone: Not on file    Gets together: Not on file    Attends religious service: Not on file    Active member of club or organization: Not on file    Attends meetings of clubs or organizations: Not on file    Relationship status: Not on file  Other Topics Concern  . Not on file  Social History Narrative  . Not on file   Outpatient Encounter Medications as of 11/12/2017  Medication Sig  . Blood Glucose Monitoring Suppl (ACCU-CHEK GUIDE) w/Device KIT 1 Piece by Does not apply route as directed.  . insulin degludec (TRESIBA FLEXTOUCH) 100 UNIT/ML SOPN FlexTouch Pen Inject 0.15 mLs (15 Units total) into the skin daily at 10 pm.  . insulin degludec (TRESIBA FLEXTOUCH) 100 UNIT/ML SOPN FlexTouch Pen Inject 0.2 mLs (20 Units total) into the skin at bedtime.   . Insulin Pen Needle (B-D ULTRAFINE III SHORT PEN) 31G X 8 MM MISC 1 each by Does not apply route as directed.  Marland Kitchen lisinopril (PRINIVIL,ZESTRIL) 20 MG tablet TAKE 1 TABLET BY MOUTH ONCE DAILY.  . simvastatin (ZOCOR) 40 MG tablet TAKE 1 TABLET BY MOUTH ONCE DAILY IN THE EVENING  . sitaGLIPtin-metformin (JANUMET) 50-1000 MG tablet TAKE (1) TABLET TWICE A DAY WITH FOOD---BREAKFAST AND SUPPER.   No facility-administered encounter medications on file as of 11/12/2017.    ALLERGIES: No Known Allergies VACCINATION STATUS:  There is no immunization history on file for this patient.  Diabetes  He presents for his follow-up diabetic visit. He has type 2 diabetes mellitus. Onset time: He was diagnosed at approximate age of 37 years. His disease course has been improving. There are no hypoglycemic associated symptoms. Pertinent negatives for hypoglycemia include no confusion, headaches, pallor or seizures. Pertinent negatives for diabetes include no chest pain, no fatigue, no polydipsia, no polyphagia, no polyuria and no weakness. There are no hypoglycemic complications. Symptoms are improving. There are no diabetic complications. Risk factors for coronary artery disease include diabetes mellitus, dyslipidemia, hypertension, family history, obesity, sedentary lifestyle, tobacco exposure  and male sex. Current diabetic treatment includes oral agent (dual therapy). He is compliant with treatment most of the time. His weight is fluctuating minimally. He is following a generally unhealthy diet. He has had a previous visit with a dietitian. He participates in exercise intermittently. His breakfast blood glucose range is generally 130-140 mg/dl. His bedtime blood glucose range is generally 130-140 mg/dl. His overall blood glucose range is 130-140 mg/dl. An ACE inhibitor/angiotensin II receptor blocker is being taken. Eye exam is current.  Hyperlipidemia  This is a chronic problem. The current episode started more than  1 year ago. The problem is controlled. Exacerbating diseases include diabetes and obesity. Pertinent negatives include no chest pain, myalgias or shortness of breath. Current antihyperlipidemic treatment includes statins. Risk factors for coronary artery disease include diabetes mellitus, dyslipidemia, hypertension, male sex, a sedentary lifestyle and obesity.  Hypertension  This is a chronic problem. The current episode started more than 1 year ago. The problem is controlled. Pertinent negatives include no chest pain, headaches, neck pain, palpitations or shortness of breath. Risk factors for coronary artery disease include dyslipidemia, diabetes mellitus, obesity, sedentary lifestyle and male gender. Past treatments include ACE inhibitors.     Review of Systems  Constitutional: Negative for fatigue and unexpected weight change.  HENT: Negative for dental problem, mouth sores and trouble swallowing.   Eyes: Negative for visual disturbance.  Respiratory: Negative for cough, choking, chest tightness, shortness of breath and wheezing.   Cardiovascular: Negative for chest pain, palpitations and leg swelling.  Gastrointestinal: Negative for abdominal distention, abdominal pain, constipation, diarrhea, nausea and vomiting.  Endocrine: Negative for polydipsia, polyphagia and polyuria.  Genitourinary: Negative for dysuria, flank pain, hematuria and urgency.  Musculoskeletal: Negative for back pain, gait problem, myalgias and neck pain.  Skin: Negative for pallor, rash and wound.  Neurological: Negative for seizures, syncope, weakness, numbness and headaches.  Psychiatric/Behavioral: Negative.  Negative for confusion and dysphoric mood.    Objective:    BP 125/76   Pulse (!) 59   Ht '6\' 2"'  (1.88 m)   Wt 273 lb (123.8 kg)   SpO2 96%   BMI 35.05 kg/m   Wt Readings from Last 3 Encounters:  11/12/17 273 lb (123.8 kg)  06/07/17 268 lb (121.6 kg)  03/29/17 269 lb (122 kg)    Physical Exam   Constitutional: He is oriented to person, place, and time. He appears well-developed. He is cooperative. No distress.  HENT:  Head: Normocephalic and atraumatic.  Eyes: EOM are normal.  Neck: Normal range of motion. Neck supple. No tracheal deviation present. No thyromegaly present.  Cardiovascular: Normal rate, S1 normal and S2 normal. Exam reveals no gallop.  No murmur heard. Pulses:      Dorsalis pedis pulses are 1+ on the right side, and 1+ on the left side.       Posterior tibial pulses are 1+ on the right side, and 1+ on the left side.  Pulmonary/Chest: Effort normal. No respiratory distress. He has no wheezes.  Abdominal: He exhibits no distension. There is no tenderness. There is no guarding and no CVA tenderness.  Musculoskeletal: He exhibits no edema.       Right shoulder: He exhibits no swelling and no deformity.  Neurological: He is alert and oriented to person, place, and time. He has normal strength and normal reflexes. No cranial nerve deficit or sensory deficit. Gait normal.  Skin: Skin is warm and dry. No rash noted. No cyanosis. Nails show no clubbing.  Psychiatric: He has a normal mood and affect. His speech is normal. Judgment normal. Cognition and memory are normal.    Results for orders placed or performed in visit on 06/07/17  COMPLETE METABOLIC PANEL WITH GFR  Result Value Ref Range   Glucose, Bld 134 (H) 65 - 99 mg/dL   BUN 21 7 - 25 mg/dL   Creat 0.82 0.70 - 1.33 mg/dL   GFR, Est Non African American 103 > OR = 60 mL/min/1.24m   GFR, Est African American 120 > OR = 60 mL/min/1.79m  BUN/Creatinine Ratio NOT APPLICABLE 6 - 22 (calc)   Sodium 141 135 - 146 mmol/L   Potassium 4.4 3.5 - 5.3 mmol/L   Chloride 107 98 - 110 mmol/L   CO2 26 20 - 32 mmol/L   Calcium 9.3 8.6 - 10.3 mg/dL   Total Protein 6.5 6.1 - 8.1 g/dL   Albumin 4.5 3.6 - 5.1 g/dL   Globulin 2.0 1.9 - 3.7 g/dL (calc)   AG Ratio 2.3 1.0 - 2.5 (calc)   Total Bilirubin 0.3 0.2 - 1.2 mg/dL    Alkaline phosphatase (APISO) 35 (L) 40 - 115 U/L   AST 18 10 - 35 U/L   ALT 18 9 - 46 U/L  Hemoglobin A1c  Result Value Ref Range   Hgb A1c MFr Bld 6.1 (H) <5.7 % of total Hgb   Mean Plasma Glucose 128 (calc)   eAG (mmol/L) 7.1 (calc)   Complete Blood Count (Most recent): Lab Results  Component Value Date   HGB 16.7 04/22/2008   HCT 49.0 04/22/2008   Chemistry (most recent): Lab Results  Component Value Date   NA 141 11/02/2017   K 4.4 11/02/2017   CL 107 11/02/2017   CO2 26 11/02/2017   BUN 21 11/02/2017   CREATININE 0.82 11/02/2017   Diabetic Labs (most recent): Lab Results  Component Value Date   HGBA1C 6.1 (H) 11/02/2017   HGBA1C 6.8 (H) 05/23/2017   HGBA1C 11.5 (H) 03/08/2017   Lipid Panel     Component Value Date/Time   CHOL 122 02/12/2016 0834   TRIG 51 02/12/2016 0834   HDL 31 (L) 02/12/2016 0834   CHOLHDL 3.9 02/12/2016 0834   VLDL 10 02/12/2016 0834   LDLCALC 81 02/12/2016 0834     Assessment & Plan:   1. Uncontrolled type 2 diabetes mellitus with complication, without long-term current use of insulin ([facility]-He presents with significant improvement in his glycemic profile and A1c of 6.1%, however, he remains at a high risk for more acute and chronic complications of diabetes which include CAD, CVA, CKD, retinopathy, and neuropathy. These are all discussed in detail with the patient.   Recent labs reviewed.   - I have re-counseled the patient on diet management and weight loss  by adopting a carbohydrate restricted / protein rich  Diet.  -  Suggestion is made for him to avoid simple carbohydrates  from his diet including Cakes, Sweet Desserts / Pastries, Ice Cream, Soda (diet and regular), Sweet Tea, Candies, Chips, Cookies, Store Bought Juices, Alcohol in Excess of  1-2 drinks a day, Artificial Sweeteners, and "Sugar-free" Products. This will help patient to have stable blood glucose profile and potentially avoid unintended weight gain.  - Patient is  advised to stick to a routine mealtimes to eat 3 meals  a day and avoid unnecessary snacks ( to snack only to correct hypoglycemia).  - He is  approached to stay on his basal insulin, and  he accepts.  He is advised to continue Tresiba 15 units nightly associated with monitoring of blood glucose 2 times a day-daily before breakfast and at bedtime.    -He is encouraged to call clinic if he registers less than 70 or greater than 200 x 3. -He is advised to continue Janumet 50/1000mg po BID.  - Patient specific target  for A1c; LDL, HDL, Triglycerides, and  Waist Circumference were discussed in detail.  2) BP/HTN: His blood pressure is controlled to target.  He is advised to continue current medications including ACEI/ARB. 3) Lipids/HPL: His lipid panel is controlled with LDL of 81.  He is advised to continue simvastatin 40 mg p.o. nightly.    4)  Weight/Diet: CDE consult in progress, exercise, and carbohydrates information provided.  5) Chronic Care/Health Maintenance:  -Patient is on ACEI/ARB and Statin medications and encouraged to continue to follow up with Ophthalmology, Podiatrist at least yearly or according to recommendations, and advised to  stay away from smoking. I have recommended yearly flu vaccine and pneumonia vaccination at least every 5 years; moderate intensity exercise for up to 150 minutes weekly; and  sleep for at least 7 hours a day.  I advised patient to maintain close follow up with his PCP for primary care needs.  - Time spent with the patient: 25 min, of which >50% was spent in reviewing his blood glucose logs , discussing his hypo- and hyper-glycemic episodes, reviewing his current and  previous labs and insulin doses and developing a plan to avoid hypo- and hyper-glycemia. Please refer to Patient Instructions for Blood Glucose Monitoring and Insulin/Medications Dosing Guide"  in media tab for additional information. Preston Mack participated in the discussions, expressed  understanding, and voiced agreement with the above plans.  All questions were answered to his satisfaction. he is encouraged to contact clinic should he have any questions or concerns prior to his return visit.   Follow up plan: Return in about 6 months (around 05/14/2018) for Meter, and Logs, Meter, and Logs.  Glade Lloyd, MD Phone: [phone]  Fax: [phone]  This note was partially dictated with voice recognition software. Similar sounding words can be transcribed inadequately or may not  be corrected upon review.  11/12/2017, 5:25 PM

## 2017-11-12 NOTE — Patient Instructions (Signed)

## 2018-01-01 ENCOUNTER — Other Ambulatory Visit: Payer: Self-pay | Admitting: "Endocrinology

## 2018-02-04 ENCOUNTER — Other Ambulatory Visit: Payer: Self-pay | Admitting: "Endocrinology

## 2018-02-12 ENCOUNTER — Other Ambulatory Visit: Payer: Self-pay | Admitting: "Endocrinology

## 2018-04-18 ENCOUNTER — Other Ambulatory Visit: Payer: Self-pay | Admitting: "Endocrinology

## 2018-04-26 ENCOUNTER — Other Ambulatory Visit: Payer: Self-pay | Admitting: "Endocrinology

## 2018-04-26 ENCOUNTER — Telehealth: Payer: Self-pay

## 2018-04-26 DIAGNOSIS — IMO0002 Reserved for concepts with insufficient information to code with codable children: Secondary | ICD-10-CM

## 2018-04-26 DIAGNOSIS — E118 Type 2 diabetes mellitus with unspecified complications: Secondary | ICD-10-CM

## 2018-04-26 DIAGNOSIS — E782 Mixed hyperlipidemia: Secondary | ICD-10-CM

## 2018-04-26 DIAGNOSIS — E1165 Type 2 diabetes mellitus with hyperglycemia: Secondary | ICD-10-CM

## 2018-04-26 DIAGNOSIS — I10 Essential (primary) hypertension: Secondary | ICD-10-CM

## 2018-04-26 MED ORDER — SIMVASTATIN 40 MG PO TABS: 40.0000 mg | ORAL_TABLET | Freq: Every evening | ORAL | 0 refills | Status: DC

## 2018-04-26 MED ORDER — INSULIN DEGLUDEC 100 UNIT/ML ~~LOC~~ SOPN: PEN_INJECTOR | SUBCUTANEOUS | 2 refills | Status: DC

## 2018-04-26 MED ORDER — INSULIN DEGLUDEC 100 UNIT/ML ~~LOC~~ SOPN: PEN_INJECTOR | SUBCUTANEOUS | 0 refills | Status: DC

## 2018-04-26 MED ORDER — SITAGLIPTIN PHOS-METFORMIN HCL 50-1000 MG PO TABS: ORAL_TABLET | ORAL | 0 refills | Status: DC

## 2018-04-26 MED ORDER — LISINOPRIL 20 MG PO TABS: 20.0000 mg | ORAL_TABLET | Freq: Every day | ORAL | 0 refills | Status: DC

## 2018-04-26 NOTE — Telephone Encounter (Signed)
Preston Mack, CMA  

## 2018-04-26 NOTE — Telephone Encounter (Signed)
Patient is asking if Dr Fransico Him will write a letter for his job stating that he does have a medical condition of Type 2 DM and will need to stay in contact for this purpose, please advise?

## 2018-04-29 NOTE — Telephone Encounter (Signed)
I suggest he follows the general recommendations by school, local and state guideline. Nothing specific because of diabetes for now.

## 2018-04-30 NOTE — Telephone Encounter (Signed)
Noted  

## 2018-05-07 ENCOUNTER — Other Ambulatory Visit: Payer: Self-pay | Admitting: "Endocrinology

## 2018-05-07 ENCOUNTER — Encounter: Payer: BC Managed Care – PPO | Admitting: "Endocrinology

## 2018-05-07 DIAGNOSIS — IMO0002 Reserved for concepts with insufficient information to code with codable children: Secondary | ICD-10-CM

## 2018-05-07 DIAGNOSIS — E118 Type 2 diabetes mellitus with unspecified complications: Principal | ICD-10-CM

## 2018-05-07 DIAGNOSIS — E1165 Type 2 diabetes mellitus with hyperglycemia: Secondary | ICD-10-CM

## 2018-05-17 LAB — COMPLETE METABOLIC PANEL WITH GFR
AG RATIO: 2.2 (calc) (ref 1.0–2.5)
ALT: 21 U/L (ref 9–46)
AST: 16 U/L (ref 10–35)
Albumin: 4.6 g/dL (ref 3.6–5.1)
Alkaline phosphatase (APISO): 30 U/L — ABNORMAL LOW (ref 35–144)
BUN: 19 mg/dL (ref 7–25)
CALCIUM: 9.3 mg/dL (ref 8.6–10.3)
CO2: 27 mmol/L (ref 20–32)
CREATININE: 0.79 mg/dL (ref 0.70–1.33)
Chloride: 106 mmol/L (ref 98–110)
GFR, EST AFRICAN AMERICAN: 121 mL/min/{1.73_m2} (ref >=60)
GFR, EST NON AFRICAN AMERICAN: 105 mL/min/{1.73_m2} (ref >=60)
GLOBULIN: 2.1 g/dL (ref 1.9–3.7)
Glucose, Bld: 182 mg/dL — ABNORMAL HIGH (ref 65–99)
POTASSIUM: 4.5 mmol/L (ref 3.5–5.3)
Sodium: 141 mmol/L (ref 135–146)
TOTAL PROTEIN: 6.7 g/dL (ref 6.1–8.1)
Total Bilirubin: 0.5 mg/dL (ref 0.2–1.2)

## 2018-05-17 LAB — HEMOGLOBIN A1C
Hgb A1c MFr Bld: 6.8 % of total Hgb — ABNORMAL HIGH (ref <5.7)
Mean Plasma Glucose: 148 (calc)
eAG (mmol/L): 8.2 (calc)

## 2018-07-24 ENCOUNTER — Emergency Department (HOSPITAL_COMMUNITY)
Admission: EM | Admit: 2018-07-24 | Discharge: 2018-07-24 | Disposition: A | Discharge status: Home / Self Care | Payer: No Typology Code available for payment source | Attending: Emergency Medicine | Admitting: Emergency Medicine

## 2018-07-24 ENCOUNTER — Emergency Department (HOSPITAL_COMMUNITY): Payer: No Typology Code available for payment source

## 2018-07-24 ENCOUNTER — Encounter (HOSPITAL_COMMUNITY): Payer: Self-pay | Admitting: Emergency Medicine

## 2018-07-24 ENCOUNTER — Other Ambulatory Visit: Payer: Self-pay

## 2018-07-24 DIAGNOSIS — W11XXXA Fall on and from ladder, initial encounter: Secondary | ICD-10-CM | POA: Diagnosis not present

## 2018-07-24 DIAGNOSIS — Z79899 Other long term (current) drug therapy: Secondary | ICD-10-CM | POA: Insufficient documentation

## 2018-07-24 DIAGNOSIS — Y939 Activity, unspecified: Secondary | ICD-10-CM | POA: Diagnosis not present

## 2018-07-24 DIAGNOSIS — S40011A Contusion of right shoulder, initial encounter: Secondary | ICD-10-CM

## 2018-07-24 DIAGNOSIS — Z794 Long term (current) use of insulin: Secondary | ICD-10-CM | POA: Diagnosis not present

## 2018-07-24 DIAGNOSIS — E119 Type 2 diabetes mellitus without complications: Secondary | ICD-10-CM | POA: Insufficient documentation

## 2018-07-24 DIAGNOSIS — I10 Essential (primary) hypertension: Secondary | ICD-10-CM | POA: Diagnosis not present

## 2018-07-24 DIAGNOSIS — Y990 Civilian activity done for income or pay: Secondary | ICD-10-CM | POA: Insufficient documentation

## 2018-07-24 DIAGNOSIS — S40021A Contusion of right upper arm, initial encounter: Secondary | ICD-10-CM | POA: Insufficient documentation

## 2018-07-24 DIAGNOSIS — S4991XA Unspecified injury of right shoulder and upper arm, initial encounter: Secondary | ICD-10-CM | POA: Diagnosis present

## 2018-07-24 DIAGNOSIS — Y929 Unspecified place or not applicable: Secondary | ICD-10-CM | POA: Insufficient documentation

## 2018-07-24 DIAGNOSIS — Z87891 Personal history of nicotine dependence: Secondary | ICD-10-CM | POA: Diagnosis not present

## 2018-07-24 DIAGNOSIS — W19XXXA Unspecified fall, initial encounter: Secondary | ICD-10-CM

## 2018-07-24 DIAGNOSIS — S46011A Strain of muscle(s) and tendon(s) of the rotator cuff of right shoulder, initial encounter: Secondary | ICD-10-CM

## 2018-07-24 HISTORY — DX: Strain of muscle(s) and tendon(s) of the rotator cuff of right shoulder, initial encounter: S46.011A

## 2018-07-24 MED ORDER — HYDROCODONE-ACETAMINOPHEN 5-325 MG PO TABS: 1.0000 | ORAL_TABLET | ORAL | 0 refills | Status: DC | PRN

## 2018-07-24 MED ORDER — CYCLOBENZAPRINE HCL 10 MG PO TABS
10.0000 mg | ORAL_TABLET | Freq: Once | ORAL | Status: AC
  Administered 2018-07-24: 14:00:00 10 mg via ORAL
  Filled 2018-07-24: qty 1

## 2018-07-24 MED ORDER — HYDROCODONE-ACETAMINOPHEN 5-325 MG PO TABS
2.0000 | ORAL_TABLET | Freq: Once | ORAL | Status: AC
  Administered 2018-07-24: 2 via ORAL
  Filled 2018-07-24: qty 2

## 2018-07-24 MED ORDER — CYCLOBENZAPRINE HCL 10 MG PO TABS: 10.0000 mg | ORAL_TABLET | Freq: Three times a day (TID) | ORAL | 0 refills | Status: DC

## 2018-07-24 NOTE — Discharge Instructions (Signed)
The x-rays of your shoulder and elbow are negative for fracture or dislocation.  There are no gross neurologic or vascular deficits appreciated.  I do have some concerns for possible rotator cuff injury following your accident.  Please use the shoulder immobilizer when you are up and about.  Please see Dr. Percell Miller or a member of his team concerning your shoulder as soon as possible.  Please use Flexeril 3 times daily for spasm pain, use Tylenol extra strength for mild pain every 4 hours.  Use Norco for more severe pain.  Flexeril and Norco may cause drowsiness, and or lightheadedness.  Please do not drive a vehicle, operate machinery, drink alcohol, or participate in activities requiring concentration when taking either these medications.

## 2018-07-24 NOTE — ED Provider Notes (Signed)
[facility] EMERGENCY DEPARTMENT Provider Note   CSN: [id number] Arrival date & time: 07/24/18  1046     History   Chief Complaint Chief Complaint  Patient presents with  . Fall    HPI Preston Mack is a 53 y.o. male.     Pt about 6 feet off a ladder and injured the right elbow and shoulder.  The history is provided by the patient.  Fall This is a new problem. The current episode started 1 to 2 hours ago. The problem occurs constantly. The problem has been gradually worsening. Pertinent negatives include no chest pain, no abdominal pain, no headaches and no shortness of breath. Associated symptoms comments: Pain of the right elbow and shoulder.. Exacerbated by: movement of right upper extremity. Nothing relieves the symptoms. He has tried nothing for the symptoms.    Past Medical History:  Diagnosis Date  . Diabetes mellitus, type II ([place])   . Hyperlipidemia     Patient Active Problem List   Diagnosis Date Noted  . Uncontrolled type 2 diabetes mellitus with complication, without long-term current use of insulin ([place]) 12/09/2014  . Essential hypertension, benign 12/09/2014  . Mixed hyperlipidemia 12/09/2014  . Class 1 obesity due to excess calories with serious comorbidity and body mass index (BMI) of 34.0 to 34.9 in adult 12/09/2014    Past Surgical History:  Procedure Laterality Date  . Lipoma removal    . Osteophyte of bone          Home Medications    Prior to Admission medications   Medication Sig Start Date End Date Taking? Authorizing Provider  B-D ULTRAFINE III SHORT PEN 31G X 8 MM MISC USE AS DIRECTED 02/04/18   Nida, Marella Chimes, MD  Blood Glucose Monitoring Suppl (ACCU-CHEK GUIDE) w/Device KIT 1 Piece by Does not apply route as directed. 03/14/17   Cassandria Anger, MD  glucose blood (TRUE METRIX BLOOD GLUCOSE TEST) test strip Test BG 2 x daily. e11.65 01/01/18   Cassandria Anger, MD  insulin degludec (TRESIBA FLEXTOUCH) 100 UNIT/ML SOPN  FlexTouch Pen Inject 0.15 mLs (15 Units total) into the skin daily at 10 pm. 06/07/17   Cassandria Anger, MD  insulin degludec (TRESIBA FLEXTOUCH) 100 UNIT/ML SOPN FlexTouch Pen INJECT 0.2MLS (20 UNITS TOTAL) INTO THE SKIN AT BEDTIME. 04/26/18   Nida, Marella Chimes, MD  lisinopril (PRINIVIL,ZESTRIL) 20 MG tablet TAKE 1 TABLET BY MOUTH ONCE DAILY. 04/26/18   Cassandria Anger, MD  simvastatin (ZOCOR) 40 MG tablet TAKE 1 TABLET BY MOUTH ONCE DAILY IN THE EVENING 04/26/18   Cassandria Anger, MD  sitaGLIPtin-metformin (JANUMET) 50-1000 MG tablet TAKE (1) TABLET TWICE A DAY WITH FOOD---BREAKFAST AND SUPPER. 04/26/18   Nida, Marella Chimes, MD    Family History Family History  Problem Relation Age of Onset  . Cancer Mother   . Cancer Father   . Cancer Maternal Grandmother   . Cancer Maternal Grandfather   . Cancer Paternal Grandmother   . Diabetes Paternal Grandmother   . Cancer Paternal Grandfather     Social History Social History   Tobacco Use  . Smoking status: Former Research scientist (life sciences)  . Smokeless tobacco: Never Used  Substance Use Topics  . Alcohol use: Yes    Alcohol/week: 0.0 standard drinks    Comment: occas  . Drug use: No     Allergies   Patient has no known allergies.   Review of Systems Review of Systems  Constitutional: Negative for activity change.  All ROS Neg except as noted in HPI  HENT: Negative for nosebleeds.   Eyes: Negative for photophobia and discharge.  Respiratory: Negative for cough, shortness of breath and wheezing.   Cardiovascular: Negative for chest pain and palpitations.  Gastrointestinal: Negative for abdominal pain and blood in stool.  Genitourinary: Negative for dysuria, frequency and hematuria.  Musculoskeletal: Positive for arthralgias. Negative for back pain and neck pain.  Skin: Negative.   Neurological: Negative for dizziness, seizures, speech difficulty and headaches.  Psychiatric/Behavioral: Negative for confusion and  hallucinations.     Physical Exam Updated Vital Signs BP 138/71 (BP Location: Left Arm)   Pulse 70   Temp 98 F (36.7 C) (Oral)   Resp 16   SpO2 98%   Physical Exam Vitals signs and nursing note reviewed.  Constitutional:      Appearance: He is well-developed. He is not toxic-appearing.  HENT:     Head: Normocephalic.     Right Ear: Tympanic membrane and external ear normal.     Left Ear: Tympanic membrane and external ear normal.  Eyes:     General: Lids are normal.     Pupils: Pupils are equal, round, and reactive to light.  Neck:     Musculoskeletal: Normal range of motion and neck supple.     Vascular: No carotid bruit.  Cardiovascular:     Rate and Rhythm: Normal rate and regular rhythm.     Pulses: Normal pulses.     Heart sounds: Normal heart sounds.  Pulmonary:     Effort: No respiratory distress.     Breath sounds: Normal breath sounds.  Abdominal:     General: Bowel sounds are normal.     Palpations: Abdomen is soft.     Tenderness: There is no abdominal tenderness. There is no guarding.  Musculoskeletal:     Right shoulder: He exhibits decreased range of motion, tenderness and pain.     Right elbow: Tenderness found.  Lymphadenopathy:     Head:     Right side of head: No submandibular adenopathy.     Left side of head: No submandibular adenopathy.     Cervical: No cervical adenopathy.  Skin:    General: Skin is warm and dry.  Neurological:     Mental Status: He is alert and oriented to person, place, and time.     Cranial Nerves: No cranial nerve deficit.     Sensory: No sensory deficit.  Psychiatric:        Speech: Speech normal.      ED Treatments / Results  Labs (all labs ordered are listed, but only abnormal results are displayed) Labs Reviewed - No data to display  EKG    Radiology Dg Shoulder Right  Result Date: 07/24/2018 CLINICAL DATA:  Pain after fall yesterday. EXAM: RIGHT SHOULDER - 2+ VIEW COMPARISON:  None. FINDINGS: There  is no evidence of fracture or dislocation. There is no evidence of arthropathy or other focal bone abnormality. Soft tissues are unremarkable. IMPRESSION: Negative. Electronically Signed   By: Dorise Bullion III M.D   On: 07/24/2018 11:53   Dg Elbow Complete Right  Result Date: 07/24/2018 CLINICAL DATA:  Pain after fall EXAM: RIGHT ELBOW - COMPLETE 3+ VIEW COMPARISON:  None. FINDINGS: There is no evidence of fracture, dislocation, or joint effusion. There is no evidence of arthropathy or other focal bone abnormality. Soft tissues are unremarkable. IMPRESSION: Negative. Electronically Signed   By: Dorise Bullion III M.D   On: 07/24/2018  11:54    Procedures Procedures (including critical care time)  Medications Ordered in ED Medications - No data to display   Initial Impression / Assessment and Plan / ED Course  I have reviewed the triage vital signs and the nursing notes.  Pertinent labs & imaging results that were available during my care of the patient were reviewed by me and considered in my medical decision making (see chart for details).          Final Clinical Impressions(s) / ED Diagnoses MDM  Vital signs within normal limits.  Pulse oximetry is 95% on room air.  Within normal limits by my interpretation. Patient sustained a fall and injured the right shoulder and right elbow.  X-ray of the right shoulder is negative for fracture or dislocation.  X-ray of the elbow is negative for fracture or dislocation.  There are no neurovascular deficits appreciated on examination.  I have made the patient aware of the findings at this time.  Patient sustained a fall and injured the shoulder and elbow.  No neurovascular deficit appreciated on examination.  X-ray of the right shoulder is negative for fracture or dislocation.  X-ray of the elbow is negative for fracture or dislocation.  I have discussed the findings with the patient in terms of which he understands. The examination  favors muscle strain and contusion.  The patient is asked to use Tylenol extra strength for mild pain.  Flexeril for spasm pain.  Norco for more severe pain.  The patient will follow-up with the primary physician or with Dr. Aline Brochure for orthopedic evaluation if not improving.  Patient is in agreement with this plan.   Final diagnoses:  Fall, initial encounter  Contusion of multiple sites of right shoulder and upper arm, initial encounter    ED Discharge Orders         Ordered    cyclobenzaprine (FLEXERIL) 10 MG tablet  3 times daily     07/24/18 1401    HYDROcodone-acetaminophen (NORCO/VICODIN) 5-325 MG tablet  Every 4 hours PRN     07/24/18 1401           Lily Kocher, PA-C 07/24/18 2129    Fredia Sorrow, MD 07/26/18 2308

## 2018-07-24 NOTE — ED Triage Notes (Signed)
Patient reports falling off a ladder and injuring his R shoulder and elbow. Abrasions to his L posterior lower leg.

## 2018-08-28 ENCOUNTER — Other Ambulatory Visit: Payer: Self-pay | Admitting: Physician Assistant

## 2018-08-28 DIAGNOSIS — E0800 Diabetes mellitus due to underlying condition with hyperosmolarity without nonketotic hyperglycemic-hyperosmolar coma (NKHHC): Secondary | ICD-10-CM

## 2018-08-28 DIAGNOSIS — Z794 Long term (current) use of insulin: Secondary | ICD-10-CM

## 2018-08-29 ENCOUNTER — Encounter (HOSPITAL_BASED_OUTPATIENT_CLINIC_OR_DEPARTMENT_OTHER)
Admission: RE | Admit: 2018-08-29 | Discharge: 2018-08-29 | Disposition: A | Discharge status: Home / Self Care | Payer: No Typology Code available for payment source | Source: Ambulatory Visit | Attending: Orthopedic Surgery | Admitting: Orthopedic Surgery

## 2018-08-29 ENCOUNTER — Encounter (HOSPITAL_BASED_OUTPATIENT_CLINIC_OR_DEPARTMENT_OTHER): Payer: Self-pay

## 2018-08-29 ENCOUNTER — Other Ambulatory Visit: Payer: Self-pay

## 2018-08-29 DIAGNOSIS — Z01818 Encounter for other preprocedural examination: Secondary | ICD-10-CM | POA: Diagnosis not present

## 2018-08-29 DIAGNOSIS — G4730 Sleep apnea, unspecified: Secondary | ICD-10-CM | POA: Diagnosis present

## 2018-08-29 LAB — COMPREHENSIVE METABOLIC PANEL
ALT: 30 U/L (ref 0–44)
AST: 21 U/L (ref 15–41)
Albumin: 4.3 g/dL (ref 3.5–5.0)
Alkaline Phosphatase: 34 U/L — ABNORMAL LOW (ref 38–126)
Anion gap: 11 (ref 5–15)
BUN: 18 mg/dL (ref 6–20)
CO2: 24 mmol/L (ref 22–32)
Calcium: 9.6 mg/dL (ref 8.9–10.3)
Chloride: 103 mmol/L (ref 98–111)
Creatinine, Ser: 1 mg/dL (ref 0.61–1.24)
GFR calc Af Amer: >60 mL/min (ref >60)
GFR calc non Af Amer: >60 mL/min (ref >60)
Glucose, Bld: 157 mg/dL — ABNORMAL HIGH (ref 70–99)
Potassium: 4 mmol/L (ref 3.5–5.1)
Sodium: 138 mmol/L (ref 135–145)
Total Bilirubin: 0.4 mg/dL (ref 0.3–1.2)
Total Protein: 6.7 g/dL (ref 6.5–8.1)

## 2018-08-29 LAB — CBC WITH DIFFERENTIAL/PLATELET
Abs Immature Granulocytes: 0.04 10*3/uL (ref 0.00–0.07)
Basophils Absolute: 0.1 10*3/uL (ref 0.0–0.1)
Basophils Relative: 1 %
Eosinophils Absolute: 0.2 10*3/uL (ref 0.0–0.5)
Eosinophils Relative: 3 %
HCT: 48 % (ref 39.0–52.0)
Hemoglobin: 16.6 g/dL (ref 13.0–17.0)
Immature Granulocytes: 1 %
Lymphocytes Relative: 26 %
Lymphs Abs: 2.2 10*3/uL (ref 0.7–4.0)
MCH: 30.5 pg (ref 26.0–34.0)
MCHC: 34.6 g/dL (ref 30.0–36.0)
MCV: 88.2 fL (ref 80.0–100.0)
Monocytes Absolute: 0.6 10*3/uL (ref 0.1–1.0)
Monocytes Relative: 7 %
Neutro Abs: 5.6 10*3/uL (ref 1.7–7.7)
Neutrophils Relative %: 62 %
Platelets: UNDETERMINED 10*3/uL (ref 150–400)
RBC: 5.44 MIL/uL (ref 4.22–5.81)
RDW: 12.5 % (ref 11.5–15.5)
WBC: 8.8 10*3/uL (ref 4.0–10.5)
nRBC: 0 % (ref 0.0–0.2)

## 2018-08-29 NOTE — H&P (Signed)
Preston Mack is an 53 y.o. male.   Chief Complaint: right shoulder rotator cuff tear HPI: [name] is seen for follow up from his right shoulder injury that occurred at work on July 24, 2018 when he fell off a six foot ladder injuring his right shoulder with significant pain and inability to raise his arm.  He underwent an MRI on August 07, 2018 that has revealed a partial versus complete rotator cuff tear with acute tendonitis with partial biceps tendon tear.  He continues to have significant pain with decreased range of motion.  He has been out of work.  He is a diabetic and his last Hemoglobin A1C was 6.1.  He is on insulin.  Past Medical History:  Diagnosis Date  . Diabetes mellitus, type II (HCC)   . Hyperlipidemia   . Sleep apnea    no cpap in ten years    Past Surgical History:  Procedure Laterality Date  . Lipoma removal    . Osteophyte of bone      Family History  Problem Relation Age of Onset  . Cancer Mother   . Cancer Father   . Cancer Maternal Grandmother   . Cancer Maternal Grandfather   . Cancer Paternal Grandmother   . Diabetes Paternal Grandmother   . Cancer Paternal Grandfather    Social History:  reports that he has quit smoking. He has never used smokeless tobacco. He reports current alcohol use. He reports that he does not use drugs.  Allergies: No Known Allergies  No medications prior to admission.    No results found for this or any previous visit (from the past 48 hour(s)). No results found.  Review of Systems  Constitutional: Negative.   Eyes: Negative.   Respiratory: Negative.   Cardiovascular: Negative.   Gastrointestinal: Negative.   Genitourinary: Negative.   Musculoskeletal: Positive for joint pain.  Skin: Negative.   Neurological: Negative.   Endo/Heme/Allergies: Negative.   Psychiatric/Behavioral: Negative.     Height 6\' 2"  (1.88 m), weight 122.5 kg. Physical Exam  Constitutional: He is oriented to person, place, and time. He  appears well-developed and well-nourished.  HENT:  Head: Normocephalic and atraumatic.  Mouth/Throat: Oropharynx is clear and moist.  Eyes: Pupils are equal, round, and reactive to light. Conjunctivae are normal.  Neck: Neck supple.  Cardiovascular: Normal rate.  Respiratory: Effort normal.  GI: Soft.  Genitourinary:    Genitourinary Comments: Not pertinent to current symptomatology therefore not examined.   Musculoskeletal:     Comments: Examination of his right shoulder reveals active forward flexion of 90, passive to 150 with pain and weakness.  Active abduction of 80, passive to 140 with pain and weakness.  Internal and external rotation of 50 degrees with pain and weakness.  No instability.  Examination of his left shoulder reveals full range of motion without pain, weakness or instability.  Vascular exam: Pulses are 2+ and symmetric.   Neurological: He is alert and oriented to person, place, and time.  Skin: Skin is warm and dry.  Psychiatric: He has a normal mood and affect. His behavior is normal.     Assessment Principal Problem:   Traumatic rotator cuff tear, right, initial encounter Active Problems:   Uncontrolled type 2 diabetes mellitus with complication, without long-term current use of insulin (HCC)   Essential hypertension, benign   Class 1 obesity due to excess calories with serious comorbidity and body mass index (BMI) of 34.0 to 34.9 in adult   Sleep apnea  Plan At this point I have talked to him in detail about these findings.  Would recommend with these findings that we proceed with right shoulder rotator cuff repair with possible biceps tenodesis with subacromial decompression.  Risks, complications and benefits of the surgery have been described to him in detail and he understands this completely.    Fern Asmar J Phillp Dolores, PA-C 08/29/2018, 12:45 PM

## 2018-08-29 NOTE — Progress Notes (Signed)
CBC w Diff, BMET collected awaiting pick up in fridge at this time, EKG complete (NS[facility]). Ensure given with instructions to finish at [place].

## 2018-08-31 ENCOUNTER — Other Ambulatory Visit (HOSPITAL_COMMUNITY)
Admission: RE | Admit: 2018-08-31 | Discharge: 2018-08-31 | Disposition: A | Discharge status: Home / Self Care | Payer: BC Managed Care – PPO | Source: Ambulatory Visit | Attending: Orthopedic Surgery | Admitting: Orthopedic Surgery

## 2018-08-31 DIAGNOSIS — Z1159 Encounter for screening for other viral diseases: Secondary | ICD-10-CM | POA: Insufficient documentation

## 2018-08-31 LAB — SARS CORONAVIRUS 2 (TAT 6-24 HRS): SARS Coronavirus 2: NEGATIVE

## 2018-09-03 ENCOUNTER — Other Ambulatory Visit: Payer: Self-pay | Admitting: "Endocrinology

## 2018-09-03 DIAGNOSIS — IMO0002 Reserved for concepts with insufficient information to code with codable children: Secondary | ICD-10-CM

## 2018-09-03 DIAGNOSIS — E1165 Type 2 diabetes mellitus with hyperglycemia: Secondary | ICD-10-CM

## 2018-09-04 ENCOUNTER — Ambulatory Visit (HOSPITAL_BASED_OUTPATIENT_CLINIC_OR_DEPARTMENT_OTHER): Admit: 2018-09-04 | Payer: BC Managed Care – PPO | Admitting: Orthopedic Surgery

## 2018-09-04 HISTORY — DX: Sleep apnea, unspecified: G47.30

## 2018-09-04 SURGERY — SHOULDER ARTHROSCOPY WITH ROTATOR CUFF REPAIR AND SUBACROMIAL DECOMPRESSION
Anesthesia: General | Laterality: Right

## 2018-09-10 ENCOUNTER — Ambulatory Visit (INDEPENDENT_AMBULATORY_CARE_PROVIDER_SITE_OTHER): Payer: No Typology Code available for payment source | Admitting: Cardiology

## 2018-09-10 ENCOUNTER — Other Ambulatory Visit: Payer: Self-pay

## 2018-09-10 ENCOUNTER — Encounter

## 2018-09-10 ENCOUNTER — Encounter: Payer: Self-pay | Admitting: Cardiology

## 2018-09-10 VITALS — BP 142/74 | HR 91 | Ht 74.0 in | Wt 279.1 lb

## 2018-09-10 DIAGNOSIS — R9431 Abnormal electrocardiogram [ECG] [EKG]: Secondary | ICD-10-CM | POA: Diagnosis not present

## 2018-09-10 DIAGNOSIS — E118 Type 2 diabetes mellitus with unspecified complications: Secondary | ICD-10-CM

## 2018-09-10 DIAGNOSIS — E782 Mixed hyperlipidemia: Secondary | ICD-10-CM

## 2018-09-10 DIAGNOSIS — Z01810 Encounter for preprocedural cardiovascular examination: Secondary | ICD-10-CM | POA: Diagnosis not present

## 2018-09-10 DIAGNOSIS — E1165 Type 2 diabetes mellitus with hyperglycemia: Secondary | ICD-10-CM

## 2018-09-10 DIAGNOSIS — IMO0002 Reserved for concepts with insufficient information to code with codable children: Secondary | ICD-10-CM

## 2018-09-10 NOTE — Patient Instructions (Signed)
Medication Instructions:  Your physician recommends that you continue on your current medications as directed. Please refer to the Current Medication list given to you today.  If you need a refill on your cardiac medications before your next appointment, please call your pharmacy.   Lab work: NONE If you have labs (blood work) drawn today and your tests are completely normal, you will receive your results only by: Marland Kitchen. MyChart Message (if you have MyChart) OR . A paper copy in the mail If you have any lab test that is abnormal or we need to change your treatment, we will call you to review the results.  Testing/Procedures: Your physician has requested that you have a lexiscan myoview. For further information please visit [email or web address]. Please follow instruction sheet, as given.   Cardiac Nuclear Scan A cardiac nuclear scan is a test that measures blood flow to the heart when a person is resting and when he or she is exercising. The test looks for problems such as:  Not enough blood reaching a portion of the heart.  The heart muscle not working normally. You may need this test if:  You have heart disease.  You have had abnormal lab results.  You have had heart surgery or a balloon procedure to open up blocked arteries (angioplasty).  You have chest pain.  You have shortness of breath. In this test, a radioactive dye (tracer) is injected into your bloodstream. After the tracer has traveled to your heart, an imaging device is used to measure how much of the tracer is absorbed by or distributed to various areas of your heart. This procedure is usually done at a hospital and takes 2-4 hours. Tell a health care provider about:  Any allergies you have.  All medicines you are taking, including vitamins, herbs, eye drops, creams, and over-the-counter medicines.  Any problems you or family members have had with anesthetic medicines.  Any blood disorders you have.  Any surgeries  you have had.  Any medical conditions you have.  Whether you are pregnant or may be pregnant. What are the risks? Generally, this is a safe procedure. However, problems may occur, including:  Serious chest pain and heart attack. This is only a risk if the stress portion of the test is done.  Rapid heartbeat.  Sensation of warmth in your chest. This usually passes quickly.  Allergic reaction to the tracer. What happens before the procedure?  Ask your health care provider about changing or stopping your regular medicines. This is especially important if you are taking diabetes medicines or blood thinners.  Follow instructions from your health care provider about eating or drinking restrictions.  Remove your jewelry on the day of the procedure. What happens during the procedure?  An IV will be inserted into one of your veins.  Your health care provider will inject a small amount of radioactive tracer through the IV.  You will wait for 20-40 minutes while the tracer travels through your bloodstream.  Your heart activity will be monitored with an electrocardiogram (ECG).  You will lie down on an exam table.  Images of your heart will be taken for about 15-20 minutes.  You may also have a stress test. For this test, one of the following may be done: ? You will exercise on a treadmill or stationary bike. While you exercise, your heart's activity will be monitored with an ECG, and your blood pressure will be checked. ? You will be given medicines that will increase blood flow  to parts of your heart. This is done if you are unable to exercise.  When blood flow to your heart has peaked, a tracer will again be injected through the IV.  After 20-40 minutes, you will get back on the exam table and have more images taken of your heart.  Depending on the type of tracer used, scans may need to be repeated 3-4 hours later.  Your IV line will be removed when the procedure is over. The  procedure may vary among health care providers and hospitals. What happens after the procedure?  Unless your health care provider tells you otherwise, you may return to your normal schedule, including diet, activities, and medicines.  Unless your health care provider tells you otherwise, you may increase your fluid intake. This will help to flush the contrast dye from your body. Drink enough fluid to keep your urine pale yellow.  Ask your health care provider, or the department that is doing the test: ? When will my results be ready? ? How will I get my results? Summary  A cardiac nuclear scan measures the blood flow to the heart when a person is resting and when he or she is exercising.  Tell your health care provider if you are pregnant.  Before the procedure, ask your health care provider about changing or stopping your regular medicines. This is especially important if you are taking diabetes medicines or blood thinners.  After the procedure, unless your health care provider tells you otherwise, increase your fluid intake. This will help flush the contrast dye from your body.  After the procedure, unless your health care provider tells you otherwise, you may return to your normal schedule, including diet, activities, and medicines. This information is not intended to replace advice given to you by your health care provider. Make sure you discuss any questions you have with your health care provider. Document Released: 02/18/2004 Document Revised: 07/09/2017 Document Reviewed: 07/09/2017 Elsevier Patient Education  2020 [employer]   Follow-Up: At [facility], you and your health needs are our priority.  As part of our continuing mission to provide you with exceptional heart care, we have created designated Provider Care Teams.  These Care Teams include your primary Cardiologist (physician) and Advanced Practice Providers (APPs -  Physician Assistants and Nurse Practitioners) who  all work together to provide you with the care you need, when you need it. You will need a follow up appointment as needed  Please call our office 2 months in advance to schedule Any Other Special Instructions Will Be Listed Below (If Applicable). Cardiac Nuclear Scan A cardiac nuclear scan is a test that measures blood flow to the heart when a person is resting and when he or she is exercising. The test looks for problems such as:  Not enough blood reaching a portion of the heart.  The heart muscle not working normally. You may need this test if:  You have heart disease.  You have had abnormal lab results.  You have had heart surgery or a balloon procedure to open up blocked arteries (angioplasty).  You have chest pain.  You have shortness of breath. In this test, a radioactive dye (tracer) is injected into your bloodstream. After the tracer has traveled to your heart, an imaging device is used to measure how much of the tracer is absorbed by or distributed to various areas of your heart. This procedure is usually done at a hospital and takes 2-4 hours. Tell a health care  provider about:  Any allergies you have.  All medicines you are taking, including vitamins, herbs, eye drops, creams, and over-the-counter medicines.  Any problems you or family members have had with anesthetic medicines.  Any blood disorders you have.  Any surgeries you have had.  Any medical conditions you have.  Whether you are pregnant or may be pregnant. What are the risks? Generally, this is a safe procedure. However, problems may occur, including:  Serious chest pain and heart attack. This is only a risk if the stress portion of the test is done.  Rapid heartbeat.  Sensation of warmth in your chest. This usually passes quickly.  Allergic reaction to the tracer. What happens before the procedure?  Ask your health care provider about changing or stopping your regular medicines. This is  especially important if you are taking diabetes medicines or blood thinners.  Follow instructions from your health care provider about eating or drinking restrictions.  Remove your jewelry on the day of the procedure. What happens during the procedure?  An IV will be inserted into one of your veins.  Your health care provider will inject a small amount of radioactive tracer through the IV.  You will wait for 20-40 minutes while the tracer travels through your bloodstream.  Your heart activity will be monitored with an electrocardiogram (ECG).  You will lie down on an exam table.  Images of your heart will be taken for about 15-20 minutes.  You may also have a stress test. For this test, one of the following may be done: ? You will exercise on a treadmill or stationary bike. While you exercise, your heart's activity will be monitored with an ECG, and your blood pressure will be checked. ? You will be given medicines that will increase blood flow to parts of your heart. This is done if you are unable to exercise.  When blood flow to your heart has peaked, a tracer will again be injected through the IV.  After 20-40 minutes, you will get back on the exam table and have more images taken of your heart.  Depending on the type of tracer used, scans may need to be repeated 3-4 hours later.  Your IV line will be removed when the procedure is over. The procedure may vary among health care providers and hospitals. What happens after the procedure?  Unless your health care provider tells you otherwise, you may return to your normal schedule, including diet, activities, and medicines.  Unless your health care provider tells you otherwise, you may increase your fluid intake. This will help to flush the contrast dye from your body. Drink enough fluid to keep your urine pale yellow.  Ask your health care provider, or the department that is doing the test: ? When will my results be ready? ?  How will I get my results? Summary  A cardiac nuclear scan measures the blood flow to the heart when a person is resting and when he or she is exercising.  Tell your health care provider if you are pregnant.  Before the procedure, ask your health care provider about changing or stopping your regular medicines. This is especially important if you are taking diabetes medicines or blood thinners.  After the procedure, unless your health care provider tells you otherwise, increase your fluid intake. This will help flush the contrast dye from your body.  After the procedure, unless your health care provider tells you otherwise, you may return to your normal schedule, including diet,  activities, and medicines. This information is not intended to replace advice given to you by your health care provider. Make sure you discuss any questions you have with your health care provider. Document Released: 02/18/2004 Document Revised: 07/09/2017 Document Reviewed: 07/09/2017 Elsevier Patient Education  2020 [employer]

## 2018-09-10 NOTE — Progress Notes (Signed)
Cardiology Office Note:    Date:  09/10/2018   ID:  Preston Mack, DOB Jul 06, 1965, MRN [id number]  PCP:  Sharilyn Sites, MD  Cardiologist:  Jenean Lindau, MD   Referring MD: Elsie Saas, MD    ASSESSMENT:    1. Preoperative cardiovascular examination   2. Mixed hyperlipidemia   3. Uncontrolled type 2 diabetes mellitus with complication, without long-term current use of insulin ([place])   4. Nonspecific abnormal electrocardiogram (ECG) (EKG)    PLAN:    In order of problems listed above:  1. Preoperative cardiovascular assessment: Abnormal EKG: I discussed my findings with the patient at extensive length.  In view of multiple risk factors for coronary artery disease and abnormal EKG I recommended Lexiscan sestamibi and the patient is agreeable. 2. Diabetes mellitus: Managed by primary care physician.  Diet was discussed 3. Mixed dyslipidemia: Again discussed at length and diet and exercise.  He vocalized understanding.  These issues are followed by primary care physician. 4. If the a forementioned test is negative then he is not at high risk for coronary events during the aforementioned surgery.  Meticulous hemodynamic monitoring will further reduce the risk of coronary events.  Patient will be seen in follow-up appointment on a as needed basis only.  His blood pressure will have to be monitored closely.  He denies any history of essential hypertension and is elevated today I think because of the above circumstances.   Medication Adjustments/Labs and Tests Ordered: Current medicines are reviewed at length with the patient today.  Concerns regarding medicines are outlined above.  No orders of the defined types were placed in this encounter.  No orders of the defined types were placed in this encounter.    History of Present Illness:    Preston Mack is a 53 y.o. male who is being seen today for the evaluation of preoperative cardiovascular risk assessment in view of abnormal  EKG at the request of Elsie Saas, MD.  Patient is a pleasant 53 year old male.  He is accompanied by his wife.  He has past medical history of diabetes mellitus, mixed dyslipidemia.  He mentions to me that he leads a sedentary lifestyle because of orthopedic issues affecting his hip.  He fell down at work according to the patient.  Rotator cuff injury and plans to get operated.  Preop EKG was abnormal and so he was sent here for evaluation.  He denies any chest pain orthopnea or PND.  At the time of my evaluation, the patient is alert awake oriented and in no distress.  Past Medical History:  Diagnosis Date  . Diabetes mellitus, type II ([place])   . Hyperlipidemia   . Sleep apnea    no cpap in ten years  . Traumatic rotator cuff tear, right, initial encounter 07/24/2018    Past Surgical History:  Procedure Laterality Date  . Lipoma removal    . Osteophyte of bone      Current Medications: Current Meds  Medication Sig  . B-D ULTRAFINE III SHORT PEN 31G X 8 MM MISC USE AS DIRECTED  . cyclobenzaprine (FLEXERIL) 10 MG tablet Take 1 tablet (10 mg total) by mouth 3 (three) times daily.  Marland Kitchen glucose blood (TRUE METRIX BLOOD GLUCOSE TEST) test strip Test BG 2 x daily. e11.65  . HYDROcodone-acetaminophen (NORCO/VICODIN) 5-325 MG tablet Take 1 tablet by mouth every 4 (four) hours as needed.  . insulin degludec (TRESIBA FLEXTOUCH) 100 UNIT/ML SOPN FlexTouch Pen Inject 0.15 mLs (15 Units total)  into the skin daily at 10 pm.  . lisinopril (PRINIVIL,ZESTRIL) 20 MG tablet TAKE 1 TABLET BY MOUTH ONCE DAILY.  . simvastatin (ZOCOR) 40 MG tablet TAKE 1 TABLET BY MOUTH ONCE DAILY IN THE EVENING  . sitaGLIPtin-metformin (JANUMET) 50-1000 MG tablet TAKE (1) TABLET TWICE A DAY WITH FOOD---BREAKFAST AND SUPPER.     Allergies:   Patient has no known allergies.   Social History   Socioeconomic History  . Marital status: Married    Spouse name: Not on file  . Number of children: Not on file  . Years of  education: Not on file  . Highest education level: Not on file  Occupational History  . Not on file  Social Needs  . Financial resource strain: Not on file  . Food insecurity    Worry: Not on file    Inability: Not on file  . Transportation needs    Medical: Not on file    Non-medical: Not on file  Tobacco Use  . Smoking status: Former Games developermoker  . Smokeless tobacco: Never Used  Substance and Sexual Activity  . Alcohol use: Yes    Alcohol/week: 0.0 standard drinks    Comment: occas  . Drug use: No  . Sexual activity: Not on file  Lifestyle  . Physical activity    Days per week: Not on file    Minutes per session: Not on file  . Stress: Not on file  Relationships  . Social Musicianconnections    Talks on phone: Not on file    Gets together: Not on file    Attends religious service: Not on file    Active member of club or organization: Not on file    Attends meetings of clubs or organizations: Not on file    Relationship status: Not on file  Other Topics Concern  . Not on file  Social History Narrative  . Not on file     Family History: The patient's family history includes Cancer in his father, maternal grandfather, maternal grandmother, mother, paternal grandfather, and paternal grandmother; Diabetes in his paternal grandmother.  ROS:   Please see the history of present illness.    All other systems reviewed and are negative.  EKGs/Labs/Other Studies Reviewed:    The following studies were reviewed today: EKG reveals sinus rhythm and poor anterior forces.  Possibility of old myocardial infarction.   Recent Labs: 08/29/2018: ALT 30; BUN 18; Creatinine, Ser 1.00; Hemoglobin 16.6; Platelets PLATELET CLUMPS NOTED ON SMEAR, UNABLE TO ESTIMATE; Potassium 4.0; Sodium 138  Recent Lipid Panel    Component Value Date/Time   CHOL 122 02/12/2016 0834   TRIG 51 02/12/2016 0834   HDL 31 (L) 02/12/2016 0834   CHOLHDL 3.9 02/12/2016 0834   VLDL 10 02/12/2016 0834   LDLCALC 81  02/12/2016 0834    Physical Exam:    VS:  BP (!) 142/74   Pulse 91   Ht 6\' 2"  (1.88 m)   Wt 279 lb 1.9 oz (126.6 kg)   SpO2 95%   BMI 35.84 kg/m     Wt Readings from Last 3 Encounters:  09/10/18 279 lb 1.9 oz (126.6 kg)  11/12/17 273 lb (123.8 kg)  06/07/17 268 lb (121.6 kg)     GEN: Patient is in no acute distress HEENT: Normal NECK: No JVD; No carotid bruits LYMPHATICS: No lymphadenopathy CARDIAC: S1 S2 regular, 2/6 systolic murmur at the apex. RESPIRATORY:  Clear to auscultation without rales, wheezing or rhonchi  ABDOMEN: Soft, non-tender,  non-distended MUSCULOSKELETAL:  No edema; No deformity  SKIN: Warm and dry NEUROLOGIC:  Alert and oriented x 3 PSYCHIATRIC:  Normal affect    Signed, Garwin Brothersajan R Jessieca Rhem, MD  09/10/2018 1:40 PM    [facility]

## 2018-09-17 ENCOUNTER — Telehealth (HOSPITAL_COMMUNITY): Payer: Self-pay

## 2018-09-17 NOTE — Telephone Encounter (Signed)
Spoke with the patient, instructions given. Patient stated that he understood and would be here for his test. Asked him to call back with any questions. S.Daylani Deblois EMTP

## 2018-09-19 ENCOUNTER — Telehealth: Payer: Self-pay | Admitting: *Deleted

## 2018-09-19 ENCOUNTER — Encounter: Payer: Self-pay | Admitting: *Deleted

## 2018-09-19 ENCOUNTER — Other Ambulatory Visit: Payer: Self-pay

## 2018-09-19 ENCOUNTER — Ambulatory Visit (HOSPITAL_COMMUNITY): Payer: No Typology Code available for payment source | Attending: Cardiology

## 2018-09-19 VITALS — Ht 74.0 in | Wt 279.0 lb

## 2018-09-19 DIAGNOSIS — Z01810 Encounter for preprocedural cardiovascular examination: Secondary | ICD-10-CM | POA: Diagnosis present

## 2018-09-19 DIAGNOSIS — E782 Mixed hyperlipidemia: Secondary | ICD-10-CM | POA: Insufficient documentation

## 2018-09-19 DIAGNOSIS — E118 Type 2 diabetes mellitus with unspecified complications: Secondary | ICD-10-CM | POA: Insufficient documentation

## 2018-09-19 DIAGNOSIS — R9431 Abnormal electrocardiogram [ECG] [EKG]: Secondary | ICD-10-CM | POA: Insufficient documentation

## 2018-09-19 DIAGNOSIS — E1165 Type 2 diabetes mellitus with hyperglycemia: Secondary | ICD-10-CM | POA: Insufficient documentation

## 2018-09-19 LAB — MYOCARDIAL PERFUSION IMAGING
LV dias vol: 127 mL (ref 62–150)
LV sys vol: 59 mL
Peak HR: 101 {beats}/min
Rest HR: 68 {beats}/min
SDS: 2
SRS: 0
SSS: 2
TID: 1.09

## 2018-09-19 MED ORDER — TECHNETIUM TC 99M TETROFOSMIN IV KIT
32.9000 | PACK | Freq: Once | INTRAVENOUS | Status: AC | PRN
  Administered 2018-09-19: 32.9 via INTRAVENOUS
  Filled 2018-09-19: qty 33

## 2018-09-19 MED ORDER — TECHNETIUM TC 99M TETROFOSMIN IV KIT
10.6000 | PACK | Freq: Once | INTRAVENOUS | Status: AC | PRN
  Administered 2018-09-19: 10.6 via INTRAVENOUS
  Filled 2018-09-19: qty 11

## 2018-09-19 MED ORDER — REGADENOSON 0.4 MG/5ML IV SOLN
0.4000 mg | Freq: Once | INTRAVENOUS | Status: AC
  Administered 2018-09-19: 0.4 mg via INTRAVENOUS

## 2018-09-19 NOTE — Telephone Encounter (Signed)
Telephone call to patient. Left message  of unremarkable stess test results and to call with any questions. Copy sent to PCP

## 2018-09-19 NOTE — Telephone Encounter (Signed)
-----   Message from Jenean Lindau, MD sent at 09/19/2018 12:46 PM EDT ----- The results of the study is unremarkable. Please inform patient. I will discuss in detail at next appointment. Cc  primary care/referring physician Jenean Lindau, MD 09/19/2018 12:46 PM

## 2018-09-23 NOTE — H&P (Signed)
Preston Mack is an 53 y.o. male.   Chief Complaint: right shoulder rotator cuff tear HPI: Preston Mack is seen for follow up from his right shoulder injury that occurred at work on July 24, 2018 when he fell off a six foot ladder injuring his right shoulder with significant pain and inability to raise his arm.  He underwent an MRI on August 07, 2018 that has revealed a partial versus complete rotator cuff tear with acute tendonitis with partial biceps tendon tear.  He continues to have significant pain with decreased range of motion.  He has been out of work.  He is a diabetic and his last Hemoglobin A1C was 6.1.  He is on insulin.      Past Medical History:  Diagnosis Date  . Diabetes mellitus, type II ([place])   . Hyperlipidemia   . Sleep apnea    no cpap in ten years         Past Surgical History:  Procedure Laterality Date  . Lipoma removal    . Osteophyte of bone           Family History  Problem Relation Age of Onset  . Cancer Mother   . Cancer Father   . Cancer Maternal Grandmother   . Cancer Maternal Grandfather   . Cancer Paternal Grandmother   . Diabetes Paternal Grandmother   . Cancer Paternal Grandfather    Social History:  reports that he has quit smoking. He has never used smokeless tobacco. He reports current alcohol use. He reports that he does not use drugs.  Allergies: No Known Allergies  No medications prior to admission.    Lab Results Last 48 Hours  No results found for this or any previous visit (from the past 48 hour(s)).   Imaging Results (Last 48 hours)  No results found.    Review of Systems  Constitutional: Negative.   Eyes: Negative.   Respiratory: Negative.   Cardiovascular: Negative.   Gastrointestinal: Negative.   Genitourinary: Negative.   Musculoskeletal: Positive for joint pain.  Skin: Negative.   Neurological: Negative.   Endo/Heme/Allergies: Negative.   Psychiatric/Behavioral: Negative.     Height 6\' 2"   (1.88 m), weight 122.5 kg. Physical Exam  Constitutional: He is oriented to person, place, and time. He appears well-developed and well-nourished.  HENT:  Head: Normocephalic and atraumatic.  Mouth/Throat: Oropharynx is clear and moist.  Eyes: Pupils are equal, round, and reactive to light. Conjunctivae are normal.  Neck: Neck supple.  Cardiovascular: Normal rate.  Respiratory: Effort normal.  GI: Soft.  Genitourinary:    Genitourinary Comments: Not pertinent to current symptomatology therefore not examined.   Musculoskeletal:     Comments: Examination of his right shoulder reveals active forward flexion of 90, passive to 150 with pain and weakness.  Active abduction of 80, passive to 140 with pain and weakness.  Internal and external rotation of 50 degrees with pain and weakness.  No instability.  Examination of his left shoulder reveals full range of motion without pain, weakness or instability.  Vascular exam: Pulses are 2+ and symmetric.   Neurological: He is alert and oriented to person, place, and time.  Skin: Skin is warm and dry.  Psychiatric: He has a normal mood and affect. His behavior is normal.     Assessment Principal Problem:   Traumatic rotator cuff tear, right, initial encounter Active Problems:   Uncontrolled type 2 diabetes mellitus with complication, without long-term current use of insulin ([place])  Essential hypertension, benign   Class 1 obesity due to excess calories with serious comorbidity and body mass index (BMI) of 34.0 to 34.9 in adult   Sleep apnea   Plan At this point I have talked to him in detail about these findings.  Would recommend with these findings that we proceed with right shoulder rotator cuff repair with possible biceps tenodesis with subacromial decompression.  Risks, complications and benefits of the surgery have been described to him in detail and he understands this completely.  Patient had an abnormal EKG on first preop and surgery was  postponed for cardiac clearance   Now has a low risk cardiac stress test.  Optimized for surgery  Naysa Puskas A. Gwinda PasseShepperson, PA-C Physician Assistant [facility] [phone]  09/23/2018, 5:29 PM

## 2018-09-26 ENCOUNTER — Encounter (HOSPITAL_BASED_OUTPATIENT_CLINIC_OR_DEPARTMENT_OTHER): Payer: Self-pay | Admitting: *Deleted

## 2018-09-26 ENCOUNTER — Other Ambulatory Visit: Payer: Self-pay

## 2018-09-27 ENCOUNTER — Encounter (HOSPITAL_BASED_OUTPATIENT_CLINIC_OR_DEPARTMENT_OTHER)
Admission: RE | Admit: 2018-09-27 | Discharge: 2018-09-27 | Disposition: A | Discharge status: Home / Self Care | Payer: BC Managed Care – PPO | Source: Ambulatory Visit | Attending: Orthopedic Surgery | Admitting: Orthopedic Surgery

## 2018-09-27 DIAGNOSIS — Z01812 Encounter for preprocedural laboratory examination: Secondary | ICD-10-CM | POA: Insufficient documentation

## 2018-09-27 LAB — BASIC METABOLIC PANEL
Anion gap: 10 (ref 5–15)
BUN: 14 mg/dL (ref 6–20)
CO2: 23 mmol/L (ref 22–32)
Calcium: 9.4 mg/dL (ref 8.9–10.3)
Chloride: 104 mmol/L (ref 98–111)
Creatinine, Ser: 0.83 mg/dL (ref 0.61–1.24)
GFR calc Af Amer: >60 mL/min (ref >60)
GFR calc non Af Amer: >60 mL/min (ref >60)
Glucose, Bld: 193 mg/dL — ABNORMAL HIGH (ref 70–99)
Potassium: 4.1 mmol/L (ref 3.5–5.1)
Sodium: 137 mmol/L (ref 135–145)

## 2018-09-27 NOTE — Progress Notes (Signed)

## 2018-09-28 ENCOUNTER — Other Ambulatory Visit (HOSPITAL_COMMUNITY)
Admission: RE | Admit: 2018-09-28 | Discharge: 2018-09-28 | Disposition: A | Discharge status: Home / Self Care | Payer: BC Managed Care – PPO | Source: Ambulatory Visit | Attending: Orthopedic Surgery | Admitting: Orthopedic Surgery

## 2018-09-28 DIAGNOSIS — Z01812 Encounter for preprocedural laboratory examination: Secondary | ICD-10-CM | POA: Insufficient documentation

## 2018-09-28 DIAGNOSIS — Z20828 Contact with and (suspected) exposure to other viral communicable diseases: Secondary | ICD-10-CM | POA: Diagnosis not present

## 2018-09-28 LAB — SARS CORONAVIRUS 2 (TAT 6-24 HRS): SARS Coronavirus 2: NEGATIVE

## 2018-10-02 ENCOUNTER — Encounter (HOSPITAL_BASED_OUTPATIENT_CLINIC_OR_DEPARTMENT_OTHER): Payer: Self-pay

## 2018-10-02 ENCOUNTER — Ambulatory Visit (HOSPITAL_BASED_OUTPATIENT_CLINIC_OR_DEPARTMENT_OTHER): Payer: No Typology Code available for payment source | Admitting: Anesthesiology

## 2018-10-02 ENCOUNTER — Ambulatory Visit (HOSPITAL_BASED_OUTPATIENT_CLINIC_OR_DEPARTMENT_OTHER)
Admission: RE | Admit: 2018-10-02 | Discharge: 2018-10-02 | Disposition: A | Discharge status: Home / Self Care | Payer: No Typology Code available for payment source | Source: Ambulatory Visit | Attending: Orthopedic Surgery | Admitting: Orthopedic Surgery

## 2018-10-02 ENCOUNTER — Other Ambulatory Visit: Payer: Self-pay

## 2018-10-02 ENCOUNTER — Encounter (HOSPITAL_BASED_OUTPATIENT_CLINIC_OR_DEPARTMENT_OTHER)
Admission: RE | Disposition: A | Discharge status: Home / Self Care | Payer: Self-pay | Source: Ambulatory Visit | Attending: Orthopedic Surgery

## 2018-10-02 DIAGNOSIS — S46011A Strain of muscle(s) and tendon(s) of the rotator cuff of right shoulder, initial encounter: Secondary | ICD-10-CM | POA: Insufficient documentation

## 2018-10-02 DIAGNOSIS — Z809 Family history of malignant neoplasm, unspecified: Secondary | ICD-10-CM | POA: Diagnosis not present

## 2018-10-02 DIAGNOSIS — E785 Hyperlipidemia, unspecified: Secondary | ICD-10-CM | POA: Diagnosis not present

## 2018-10-02 DIAGNOSIS — W11XXXA Fall on and from ladder, initial encounter: Secondary | ICD-10-CM | POA: Diagnosis not present

## 2018-10-02 DIAGNOSIS — S43491A Other sprain of right shoulder joint, initial encounter: Secondary | ICD-10-CM | POA: Insufficient documentation

## 2018-10-02 DIAGNOSIS — E119 Type 2 diabetes mellitus without complications: Secondary | ICD-10-CM | POA: Diagnosis not present

## 2018-10-02 DIAGNOSIS — G4730 Sleep apnea, unspecified: Secondary | ICD-10-CM | POA: Insufficient documentation

## 2018-10-02 DIAGNOSIS — Z794 Long term (current) use of insulin: Secondary | ICD-10-CM | POA: Diagnosis not present

## 2018-10-02 DIAGNOSIS — M75101 Unspecified rotator cuff tear or rupture of right shoulder, not specified as traumatic: Secondary | ICD-10-CM | POA: Diagnosis present

## 2018-10-02 DIAGNOSIS — Z833 Family history of diabetes mellitus: Secondary | ICD-10-CM | POA: Insufficient documentation

## 2018-10-02 DIAGNOSIS — M7541 Impingement syndrome of right shoulder: Secondary | ICD-10-CM | POA: Diagnosis not present

## 2018-10-02 DIAGNOSIS — I10 Essential (primary) hypertension: Secondary | ICD-10-CM | POA: Diagnosis not present

## 2018-10-02 DIAGNOSIS — Z87891 Personal history of nicotine dependence: Secondary | ICD-10-CM | POA: Diagnosis not present

## 2018-10-02 DIAGNOSIS — Y939 Activity, unspecified: Secondary | ICD-10-CM | POA: Insufficient documentation

## 2018-10-02 DIAGNOSIS — Y990 Civilian activity done for income or pay: Secondary | ICD-10-CM | POA: Diagnosis not present

## 2018-10-02 HISTORY — PX: SHOULDER ARTHROSCOPY WITH SUBACROMIAL DECOMPRESSION, ROTATOR CUFF REPAIR AND BICEP TENDON REPAIR: SHX5687

## 2018-10-02 HISTORY — PX: BICEPT TENODESIS: SHX5116

## 2018-10-02 LAB — GLUCOSE, CAPILLARY
Glucose-Capillary: 172 mg/dL — ABNORMAL HIGH (ref 70–99)
Glucose-Capillary: 210 mg/dL — ABNORMAL HIGH (ref 70–99)

## 2018-10-02 SURGERY — SHOULDER ARTHROSCOPY WITH SUBACROMIAL DECOMPRESSION, ROTATOR CUFF REPAIR AND BICEP TENDON REPAIR
Anesthesia: General | Site: Shoulder | Laterality: Right

## 2018-10-02 MED ORDER — FENTANYL CITRATE (PF) 250 MCG/5ML IJ SOLN
INTRAMUSCULAR | Status: DC | PRN
  Administered 2018-10-02 (×2): 25 ug via INTRAVENOUS

## 2018-10-02 MED ORDER — CEFAZOLIN SODIUM-DEXTROSE 2-4 GM/100ML-% IV SOLN
INTRAVENOUS | Status: AC
  Filled 2018-10-02: qty 100

## 2018-10-02 MED ORDER — ONDANSETRON HCL 4 MG/2ML IJ SOLN: 4.0000 mg | Freq: Once | INTRAMUSCULAR | Status: DC | PRN

## 2018-10-02 MED ORDER — LACTATED RINGERS IV SOLN: INTRAVENOUS | Status: DC

## 2018-10-02 MED ORDER — EPINEPHRINE PF 1 MG/ML IJ SOLN
INTRAMUSCULAR | Status: AC
  Filled 2018-10-02: qty 1

## 2018-10-02 MED ORDER — POVIDONE-IODINE 7.5 % EX SOLN: Freq: Once | CUTANEOUS | Status: DC

## 2018-10-02 MED ORDER — POVIDONE-IODINE 10 % EX SWAB: 2.0000 "application " | Freq: Once | CUTANEOUS | Status: DC

## 2018-10-02 MED ORDER — DEXAMETHASONE SODIUM PHOSPHATE 10 MG/ML IJ SOLN
INTRAMUSCULAR | Status: AC
  Filled 2018-10-02: qty 1

## 2018-10-02 MED ORDER — EPHEDRINE SULFATE-NACL 50-0.9 MG/10ML-% IV SOSY
PREFILLED_SYRINGE | INTRAVENOUS | Status: DC | PRN
  Administered 2018-10-02 (×4): 5 mg via INTRAVENOUS
  Administered 2018-10-02 (×2): 10 mg via INTRAVENOUS
  Administered 2018-10-02 (×2): 5 mg via INTRAVENOUS

## 2018-10-02 MED ORDER — BUPIVACAINE-EPINEPHRINE (PF) 0.25% -1:200000 IJ SOLN
INTRAMUSCULAR | Status: AC
  Filled 2018-10-02: qty 30

## 2018-10-02 MED ORDER — LIDOCAINE 2% (20 MG/ML) 5 ML SYRINGE
INTRAMUSCULAR | Status: DC | PRN
  Administered 2018-10-02: 60 mg via INTRAVENOUS

## 2018-10-02 MED ORDER — PROPOFOL 10 MG/ML IV BOLUS
INTRAVENOUS | Status: AC
  Filled 2018-10-02: qty 20

## 2018-10-02 MED ORDER — OXYCODONE HCL 5 MG PO TABS: 5.0000 mg | ORAL_TABLET | Freq: Once | ORAL | Status: DC | PRN

## 2018-10-02 MED ORDER — DEXTROSE 5 % IV SOLN
3.0000 g | INTRAVENOUS | Status: AC
  Administered 2018-10-02: 08:00:00 3 g via INTRAVENOUS

## 2018-10-02 MED ORDER — CYCLOBENZAPRINE HCL 5 MG PO TABS: 5.0000 mg | ORAL_TABLET | Freq: Three times a day (TID) | ORAL | 0 refills | Status: DC | PRN

## 2018-10-02 MED ORDER — FENTANYL CITRATE (PF) 100 MCG/2ML IJ SOLN
INTRAMUSCULAR | Status: AC
  Filled 2018-10-02: qty 2

## 2018-10-02 MED ORDER — MIDAZOLAM HCL 2 MG/2ML IJ SOLN
INTRAMUSCULAR | Status: AC
  Filled 2018-10-02: qty 2

## 2018-10-02 MED ORDER — CHLORHEXIDINE GLUCONATE 4 % EX LIQD: 60.0000 mL | Freq: Once | CUTANEOUS | Status: DC

## 2018-10-02 MED ORDER — FENTANYL CITRATE (PF) 100 MCG/2ML IJ SOLN: 25.0000 ug | INTRAMUSCULAR | Status: DC | PRN

## 2018-10-02 MED ORDER — SUGAMMADEX SODIUM 200 MG/2ML IV SOLN
INTRAVENOUS | Status: DC | PRN
  Administered 2018-10-02: 250 mg via INTRAVENOUS

## 2018-10-02 MED ORDER — PHENYLEPHRINE 40 MCG/ML (10ML) SYRINGE FOR IV PUSH (FOR BLOOD PRESSURE SUPPORT)
PREFILLED_SYRINGE | INTRAVENOUS | Status: DC | PRN
  Administered 2018-10-02: 40 ug via INTRAVENOUS
  Administered 2018-10-02 (×3): 120 ug via INTRAVENOUS

## 2018-10-02 MED ORDER — ONDANSETRON HCL 4 MG/2ML IJ SOLN
INTRAMUSCULAR | Status: DC | PRN
  Administered 2018-10-02: 4 mg via INTRAVENOUS

## 2018-10-02 MED ORDER — EPINEPHRINE PF 1 MG/ML IJ SOLN
INTRAMUSCULAR | Status: AC
  Filled 2018-10-02: qty 6

## 2018-10-02 MED ORDER — FENTANYL CITRATE (PF) 100 MCG/2ML IJ SOLN
50.0000 ug | INTRAMUSCULAR | Status: DC | PRN
  Administered 2018-10-02: 50 ug via INTRAVENOUS

## 2018-10-02 MED ORDER — OXYCODONE HCL 5 MG PO TABS: 5.0000 mg | ORAL_TABLET | ORAL | 0 refills | Status: DC | PRN

## 2018-10-02 MED ORDER — LACTATED RINGERS IV SOLN
INTRAVENOUS | Status: DC | PRN
  Administered 2018-10-02 (×2): via INTRAVENOUS

## 2018-10-02 MED ORDER — CEFAZOLIN SODIUM-DEXTROSE 1-4 GM/50ML-% IV SOLN
INTRAVENOUS | Status: AC
  Filled 2018-10-02: qty 50

## 2018-10-02 MED ORDER — ROCURONIUM BROMIDE 10 MG/ML (PF) SYRINGE
PREFILLED_SYRINGE | INTRAVENOUS | Status: DC | PRN
  Administered 2018-10-02: 10 mg via INTRAVENOUS
  Administered 2018-10-02: 50 mg via INTRAVENOUS
  Administered 2018-10-02: 10 mg via INTRAVENOUS
  Administered 2018-10-02: 30 mg via INTRAVENOUS

## 2018-10-02 MED ORDER — DEXAMETHASONE SODIUM PHOSPHATE 10 MG/ML IJ SOLN: 8.0000 mg | Freq: Once | INTRAMUSCULAR | Status: DC

## 2018-10-02 MED ORDER — OXYCODONE HCL 5 MG/5ML PO SOLN: 5.0000 mg | Freq: Once | ORAL | Status: DC | PRN

## 2018-10-02 MED ORDER — DEXAMETHASONE SODIUM PHOSPHATE 10 MG/ML IJ SOLN
INTRAMUSCULAR | Status: DC | PRN
  Administered 2018-10-02: 4 mg via INTRAVENOUS

## 2018-10-02 MED ORDER — SUGAMMADEX SODIUM 500 MG/5ML IV SOLN
INTRAVENOUS | Status: AC
  Filled 2018-10-02: qty 5

## 2018-10-02 MED ORDER — PROPOFOL 10 MG/ML IV BOLUS
INTRAVENOUS | Status: DC | PRN
  Administered 2018-10-02: 30 mg via INTRAVENOUS
  Administered 2018-10-02: 200 mg via INTRAVENOUS
  Administered 2018-10-02: 50 mg via INTRAVENOUS

## 2018-10-02 MED ORDER — SODIUM CHLORIDE 0.9 % IR SOLN
Status: DC | PRN
  Administered 2018-10-02: 08:00:00 15000 mL

## 2018-10-02 MED ORDER — MIDAZOLAM HCL 2 MG/2ML IJ SOLN
1.0000 mg | INTRAMUSCULAR | Status: DC | PRN
  Administered 2018-10-02: 1 mg via INTRAVENOUS

## 2018-10-02 SURGICAL SUPPLY — 88 items
AID PSTN UNV HD RSTRNT DISP (MISCELLANEOUS) ×1
ANCH SUT 2 SWLK 19.1 CLS EYLT (Anchor) ×1 IMPLANT
ANCH SUT SWLK 19.1X4.75 (Anchor) ×1 IMPLANT
ANCHOR SUT BIO SW 4.75X19.1 (Anchor) ×1 IMPLANT
ANCHOR SWIVELOCK BIO 4.75X19.1 (Anchor) ×1 IMPLANT
APL SKNCLS STERI-STRIP NONHPOA (GAUZE/BANDAGES/DRESSINGS)
BENZOIN TINCTURE PRP APPL 2/3 (GAUZE/BANDAGES/DRESSINGS) IMPLANT
BLADE EXCALIBUR 4.0X13 (MISCELLANEOUS) ×1 IMPLANT
BLADE SURG 11 STRL SS (BLADE) ×1 IMPLANT
BLADE SURG 15 STRL LF DISP TIS (BLADE) IMPLANT
BLADE SURG 15 STRL SS (BLADE)
BNDG COHESIVE 4X5 TAN STRL (GAUZE/BANDAGES/DRESSINGS) ×2 IMPLANT
BNDG COHESIVE 6X5 TAN STRL LF (GAUZE/BANDAGES/DRESSINGS) ×1 IMPLANT
BURR OVAL 8 FLU 5.0X13 (MISCELLANEOUS) ×2 IMPLANT
CANNULA TWIST IN 8.25X7CM (CANNULA) ×2 IMPLANT
COVER WAND RF STERILE (DRAPES) IMPLANT
DECANTER SPIKE VIAL GLASS SM (MISCELLANEOUS) IMPLANT
DISSECTOR  3.8MM X 13CM (MISCELLANEOUS) ×1
DISSECTOR 3.8MM X 13CM (MISCELLANEOUS) ×1 IMPLANT
DRAPE HALF SHEET 70X43 (DRAPES) IMPLANT
DRAPE SHOULDER BEACH CHAIR (DRAPES) ×2 IMPLANT
DRAPE U-SHAPE 47X51 STRL (DRAPES) ×4 IMPLANT
DRSG PAD ABDOMINAL 8X10 ST (GAUZE/BANDAGES/DRESSINGS) ×2 IMPLANT
DURAPREP 26ML APPLICATOR (WOUND CARE) ×2 IMPLANT
ELECT REM PT RETURN 9FT ADLT (ELECTROSURGICAL) ×2
ELECTRODE REM PT RTRN 9FT ADLT (ELECTROSURGICAL) ×1 IMPLANT
GAUZE SPONGE 4X4 12PLY STRL (GAUZE/BANDAGES/DRESSINGS) ×2 IMPLANT
GAUZE XEROFORM 1X8 LF (GAUZE/BANDAGES/DRESSINGS) ×2 IMPLANT
GLOVE BIO SURGEON STRL SZ7 (GLOVE) IMPLANT
GLOVE BIOGEL PI IND STRL 7.0 (GLOVE) IMPLANT
GLOVE BIOGEL PI IND STRL 7.5 (GLOVE) ×1 IMPLANT
GLOVE BIOGEL PI INDICATOR 7.0 (GLOVE) ×1
GLOVE BIOGEL PI INDICATOR 7.5 (GLOVE) ×1
GLOVE ECLIPSE 6.5 STRL STRAW (GLOVE) ×1 IMPLANT
GLOVE SS BIOGEL STRL SZ 7.5 (GLOVE) ×1 IMPLANT
GLOVE SUPERSENSE BIOGEL SZ 7.5 (GLOVE) ×1
GOWN STRL REUS W/ TWL LRG LVL3 (GOWN DISPOSABLE) ×2 IMPLANT
GOWN STRL REUS W/ TWL XL LVL3 (GOWN DISPOSABLE) ×1 IMPLANT
GOWN STRL REUS W/TWL LRG LVL3 (GOWN DISPOSABLE) ×4
GOWN STRL REUS W/TWL XL LVL3 (GOWN DISPOSABLE) ×2
IMPL SPEEDBRIDGE KIT (Orthopedic Implant) IMPLANT
IMPLANT SPEEDBRIDGE KIT (Orthopedic Implant) ×2 IMPLANT
LASSO 90 CVE QUICKPAS (DISPOSABLE) ×1 IMPLANT
LOOP 2 FIBERLINK CLOSED (SUTURE) IMPLANT
MANIFOLD NEPTUNE II (INSTRUMENTS) ×2 IMPLANT
NDL 1/2 CIR CATGUT .05X1.09 (NEEDLE) IMPLANT
NDL SAFETY ECLIPSE 18X1.5 (NEEDLE) ×1 IMPLANT
NDL SCORPION MULTI FIRE (NEEDLE) IMPLANT
NDL SUT 6 .5 CRC .975X.05 MAYO (NEEDLE) IMPLANT
NEEDLE 1/2 CIR CATGUT .05X1.09 (NEEDLE) IMPLANT
NEEDLE HYPO 18GX1.5 SHARP (NEEDLE) ×2
NEEDLE MAYO TAPER (NEEDLE)
NEEDLE SCORPION MULTI FIRE (NEEDLE) ×2 IMPLANT
PACK ARTHROSCOPY DSU (CUSTOM PROCEDURE TRAY) ×2 IMPLANT
PACK BASIN DAY SURGERY FS (CUSTOM PROCEDURE TRAY) ×2 IMPLANT
PAD ALCOHOL SWAB (MISCELLANEOUS) ×4 IMPLANT
PASSER SUT SWIFTSTITCH HIP CRT (INSTRUMENTS) ×1 IMPLANT
PENCIL BUTTON HOLSTER BLD 10FT (ELECTRODE) IMPLANT
PORT APPOLLO RF 90DEGREE MULTI (SURGICAL WAND) ×2 IMPLANT
RESTRAINT HEAD UNIVERSAL NS (MISCELLANEOUS) ×2 IMPLANT
SLEEVE SCD COMPRESS KNEE MED (MISCELLANEOUS) ×1 IMPLANT
SLING ARM FOAM STRAP LRG (SOFTGOODS) IMPLANT
SLING ULTRA III MED (ORTHOPEDIC SUPPLIES) IMPLANT
SPONGE LAP 4X18 RFD (DISPOSABLE) IMPLANT
STRIP CLOSURE SKIN 1/2X4 (GAUZE/BANDAGES/DRESSINGS) IMPLANT
SUCTION FRAZIER HANDLE 10FR (MISCELLANEOUS)
SUCTION TUBE FRAZIER 10FR DISP (MISCELLANEOUS) IMPLANT
SUT ETHILON 3 0 PS 1 (SUTURE) ×2 IMPLANT
SUT FIBERWIRE #2 38 T-5 BLUE (SUTURE) ×2
SUT PDS AB 2-0 CT2 27 (SUTURE) IMPLANT
SUT PROLENE 3 0 PS 2 (SUTURE) IMPLANT
SUT TIGER TAPE 7 IN WHITE (SUTURE) IMPLANT
SUT VIC AB 0 SH 27 (SUTURE) IMPLANT
SUT VIC AB 2-0 PS2 27 (SUTURE) IMPLANT
SUT VIC AB 2-0 SH 27 (SUTURE)
SUT VIC AB 2-0 SH 27XBRD (SUTURE) IMPLANT
SUTURE FIBERWR #2 38 T-5 BLUE (SUTURE) IMPLANT
SUTURE TAPE TIGERLINK 1.3MM BL (SUTURE) IMPLANT
SUTURETAPE TIGERLINK 1.3MM BL (SUTURE) ×2
SYR 5ML LL (SYRINGE) ×2 IMPLANT
SYR BULB 3OZ (MISCELLANEOUS) IMPLANT
TAPE FIBER 2MM 7IN #2 BLUE (SUTURE) IMPLANT
TAPE HYPAFIX 6X30 (GAUZE/BANDAGES/DRESSINGS) IMPLANT
TAPE STRIPS DRAPE STRL (GAUZE/BANDAGES/DRESSINGS) ×2 IMPLANT
TOWEL GREEN STERILE FF (TOWEL DISPOSABLE) ×2 IMPLANT
TUBE CONNECTING 20X1/4 (TUBING) ×2 IMPLANT
TUBING ARTHROSCOPY IRRIG 16FT (MISCELLANEOUS) ×2 IMPLANT
WATER STERILE IRR 1000ML POUR (IV SOLUTION) ×2 IMPLANT

## 2018-10-02 NOTE — Discharge Instructions (Signed)
Regional Anesthesia Blocks ? ?1. Numbness or the inability to move the "blocked" extremity may last from 3-48 hours after placement. The length of time depends on the medication injected and your individual response to the medication. If the numbness is not going away after 48 hours, call your surgeon. ? ?2. The extremity that is blocked will need to be protected until the numbness is gone and the  Strength has returned. Because you cannot feel it, you will need to take extra care to avoid injury. Because it may be weak, you may have difficulty moving it or using it. You may not know what position it is in without looking at it while the block is in effect. ? ?3. For blocks in the legs and feet, returning to weight bearing and walking needs to be done carefully. You will need to wait until the numbness is entirely gone and the strength has returned. You should be able to move your leg and foot normally before you try and bear weight or walk. You will need someone to be with you when you first try to ensure you do not fall and possibly risk injury. ? ?4. Bruising and tenderness at the needle site are common side effects and will resolve in a few days. ? ?5. Persistent numbness or new problems with movement should be communicated to the surgeon or the [facility] ([phone])/ [facility] ([phone]).  ? ?Post Anesthesia Home Care Instructions ? ?Activity: ?Get plenty of rest for the remainder of the day. A responsible individual must stay with you for 24 hours following the procedure.  ?For the next 24 hours, DO NOT: ?-Drive a car ?-Operate machinery ?-Drink alcoholic beverages ?-Take any medication unless instructed by your physician ?-Make any legal decisions or sign important papers. ? ?Meals: ?Start with liquid foods such as gelatin or soup. Progress to regular foods as tolerated. Avoid greasy, spicy, heavy foods. If nausea and/or vomiting occur, drink only clear liquids until the  nausea and/or vomiting subsides. Call your physician if vomiting continues. ? ?Special Instructions/Symptoms: ?Your throat may feel dry or sore from the anesthesia or the breathing tube placed in your throat during surgery. If this causes discomfort, gargle with warm salt water. The discomfort should disappear within 24 hours. ? ?If you had a scopolamine patch placed behind your ear for the management of post- operative nausea and/or vomiting: ? ?1. The medication in the patch is effective for 72 hours, after which it should be removed.  Wrap patch in a tissue and discard in the trash. Wash hands thoroughly with soap and water. ?2. You may remove the patch earlier than 72 hours if you experience unpleasant side effects which may include dry mouth, dizziness or visual disturbances. ?3. Avoid touching the patch. Wash your hands with soap and water after contact with the patch. ?    ?

## 2018-10-02 NOTE — Transfer of Care (Signed)
Immediate Anesthesia Transfer of Care Note  Patient: Preston Mack  Procedure(s) Performed: RIGHT SHOULDER ARTHROSCOPY DEBRIDEMENT,  WITH SUBACROMIAL DECOMPRESSION,  DISTAL CLAVICAL EXCISION, ROTATOR CUFF REPAIR AND  BICEP TENODESIS (Right Shoulder) BICEPS TENODESIS (Right Shoulder)  Patient Location: PACU  Anesthesia Type:GA combined with regional for post-op pain  Level of Consciousness: drowsy  Airway & Oxygen Therapy: Patient Spontanous Breathing and Patient connected to nasal cannula oxygen  Post-op Assessment: Report given to RN and Post -op Vital signs reviewed and stable  Post vital signs: Reviewed and stable  Last Vitals:  Vitals Value Taken Time  BP 124/67 10/02/18 1006  Temp    Pulse 87 10/02/18 1014  Resp 15 10/02/18 1014  SpO2 93 % 10/02/18 1014  Vitals shown include unvalidated device data.  Last Pain:  Vitals:   10/02/18 0637  TempSrc: Oral  PainSc: 3       Patients Stated Pain Goal: 5 (95/28/41 [id number])  Complications: No apparent anesthesia complications

## 2018-10-02 NOTE — Interval H&P Note (Signed)
History and Physical Interval Note:  10/02/2018 7:21 AM  Preston Mack  has presented today for surgery, with the diagnosis of RIGHT SHOULDER CARTILAGE DISORDER,IMPINGEMENT SYNDROME , PRIMARY OSTEOARTHRITIS, BICIPITAL TENDINITIS.  The various methods of treatment have been discussed with the patient and family. After consideration of risks, benefits and other options for treatment, the patient has consented to  Procedure(s) with comments: RIGHT SHOULDER ARTHROSCOPY DEBRIDEMENT,  WITH SUBACROMIAL DECOMPRESSION,  DISTAL CLAVICAL EXCISION, ROTATOR CUFF REPAIR AND  POSSIBLE BICEP TENODESIS (Right) - PRE/POST OP SCALENE as a surgical intervention.  The patient's history has been reviewed, patient examined, no change in status, stable for surgery.  I have reviewed the patient's chart and labs.  Questions were answered to the patient's satisfaction.     Lorn Junes

## 2018-10-02 NOTE — Anesthesia Preprocedure Evaluation (Addendum)
Anesthesia Evaluation  Patient identified by MRN, date of birth, ID band Patient awake    Reviewed: Allergy & Precautions, NPO status , Patient's Chart, lab work & pertinent test results  Airway Mallampati: II  TM Distance: >3 FB Neck ROM: Full    Dental  (+) Teeth Intact, Dental Advisory Given   Pulmonary former smoker,    breath sounds clear to auscultation       Cardiovascular  Rhythm:Regular Rate:Normal     Neuro/Psych    GI/Hepatic   Endo/Other  diabetes  Renal/GU      Musculoskeletal   Abdominal   Peds  Hematology   Anesthesia Other Findings   Reproductive/Obstetrics                            Anesthesia Physical Anesthesia Plan  ASA: III  Anesthesia Plan: General   Post-op Pain Management:  Regional for Post-op pain   Induction:   PONV Risk Score and Plan: Ondansetron  Airway Management Planned: Oral ETT  Additional Equipment:   Intra-op Plan:   Post-operative Plan: Extubation in OR  Informed Consent: I have reviewed the patients History and Physical, chart, labs and discussed the procedure including the risks, benefits and alternatives for the proposed anesthesia with the patient or authorized representative who has indicated his/her understanding and acceptance.     Dental advisory given  Plan Discussed with: CRNA and Anesthesiologist  Anesthesia Plan Comments:         Anesthesia Quick Evaluation

## 2018-10-02 NOTE — Progress Notes (Signed)
Assisted Dr. Joslin with right, ultrasound guided, interscalene  block. Side rails up, monitors on throughout procedure. See vital signs in flow sheet. Tolerated Procedure well. 

## 2018-10-02 NOTE — Anesthesia Procedure Notes (Signed)
Procedure Name: Intubation Performed by: Milford Cage, CRNA Pre-anesthesia Checklist: Patient identified, Emergency Drugs available, Suction available and Patient being monitored Patient Re-evaluated:Patient Re-evaluated prior to induction Oxygen Delivery Method: Circle System Utilized Preoxygenation: Pre-oxygenation with 100% oxygen Induction Type: IV induction Ventilation: Mask ventilation without difficulty and Oral airway inserted - appropriate to patient size Laryngoscope Size: Mac and 4 Grade View: Grade II Tube type: Oral Tube size: 8.0 mm Number of attempts: 1 Airway Equipment and Method: Stylet and Oral airway Placement Confirmation: ETT inserted through vocal cords under direct vision,  positive ETCO2 and breath sounds checked- equal and bilateral Secured at: 23 cm Tube secured with: Tape Dental Injury: Teeth and Oropharynx as per pre-operative assessment

## 2018-10-02 NOTE — Anesthesia Procedure Notes (Addendum)
Anesthesia Regional Block: Interscalene brachial plexus block   Pre-Anesthetic Checklist: ,, timeout performed, Correct Patient, Correct Site, Correct Laterality, Correct Procedure, Correct Position, site marked, Risks and benefits discussed,  Surgical consent,  Pre-op evaluation,  At surgeon's request and post-op pain management  Laterality: Right  Prep: chloraprep       Needles:  Injection technique: Single-shot  Needle Type: Stimulator Needle - 40      Needle Gauge: 22     Additional Needles:   Procedures:, nerve stimulator,,,,,,,  Narrative:  Start time: 10/02/2018 7:00 AM End time: 10/02/2018 7:10 AM Injection made incrementally with aspirations every 5 mL.  Performed by: Personally   Additional Notes: 20 cc 0.5% Bupivacaine with 1:200 epi and 10 cc 1.3% exparel injected easily

## 2018-10-03 ENCOUNTER — Encounter (HOSPITAL_BASED_OUTPATIENT_CLINIC_OR_DEPARTMENT_OTHER): Payer: Self-pay | Admitting: Orthopedic Surgery

## 2018-10-03 NOTE — Anesthesia Postprocedure Evaluation (Signed)
Anesthesia Post Note  Patient: FREDIS MALKIEWICZ  Procedure(s) Performed: RIGHT SHOULDER ARTHROSCOPY DEBRIDEMENT,  WITH SUBACROMIAL DECOMPRESSION,  DISTAL CLAVICAL EXCISION, ROTATOR CUFF REPAIR AND  BICEP TENODESIS (Right Shoulder) BICEPS TENODESIS (Right Shoulder)     Patient location during evaluation: PACU Anesthesia Type: General Level of consciousness: awake and alert Pain management: pain level controlled Vital Signs Assessment: post-procedure vital signs reviewed and stable Respiratory status: spontaneous breathing, nonlabored ventilation, respiratory function stable and patient connected to nasal cannula oxygen Cardiovascular status: blood pressure returned to baseline and stable Postop Assessment: no apparent nausea or vomiting Anesthetic complications: no    Last Vitals:  Vitals:   10/02/18 1112 10/02/18 1145  BP: 116/83 131/81  Pulse: 72 75  Resp: 16 16  Temp: 36.6 C 36.7 C  SpO2: 93% 94%    Last Pain:  Vitals:   10/02/18 0637  TempSrc: Oral                 Julann Mcgilvray COKER

## 2018-10-03 NOTE — Op Note (Signed)
NAME: Preston Mack, Preston Mack MEDICAL RECORD [id number] ACCOUNT [id number] DATE OF BIRTH:1965-06-17 FACILITY: [facility] LOCATION: [facility] PHYSICIAN:Tyquasia Pant Venetia Maxon, MD  OPERATIVE REPORT  DATE OF PROCEDURE:  10/02/2018  PREOPERATIVE DIAGNOSES: 1.  Right shoulder acute traumatic complex rotator cuff tear. 2.  Right shoulder acute traumatic biceps tendon and labrum tear. 3.  Right shoulder acute traumatic impingement.  POSTOPERATIVE DIAGNOSES: 1.  Right shoulder acute traumatic complex rotator cuff tear. 2.  Right shoulder acute traumatic biceps tendon and labrum tear. 3.  Right shoulder acute traumatic impingement.  PROCEDURE PERFORMED: 1.  Right shoulder EUA followed by arthroscopically assisted complex rotator cuff repair using Arthrex double row SwiveLock anchors x4. 2.  Right shoulder subscapularis repair using Arthrex SwiveLock anchor x1. 3.  Right shoulder biceps tenodesis using a loop and tack technique using Arthrex SwiveLock anchor x1. 4.  Right shoulder labrum tear, partial biceps tendon tear debridement -- extensive. 5.  Right shoulder subacromial decompression.  SURGEON:  Elsie Saas, MD  ASSISTANT:  Matthew Saras, PA.  ANESTHESIA:  General.  OPERATIVE TIME:  One hour and 45 minutes.  COMPLICATIONS:  None.  INDICATIONS:  The patient is a 53 year old gentleman who sustained a fall at work on 07/24/2018 injuring his right shoulder.  Exam and MRI has revealed a complex rotator cuff, multiple rotator cuff tear, biceps tendon and labrum tear with significant  impingement.  He has failed conservative care and is now to undergo arthroscopy and repair.  DESCRIPTION OF PROCEDURE:  The patient was brought to the operating room on 10/02/2018 after an interscalene block was placed in the holding room by Anesthesia.  He was placed on the operating table in supine position.  He received antibiotics  preoperatively for prophylaxis.  After being placed under general anesthesia,  his right shoulder was examined.  He had full range of motion and his shoulder was stable to ligamentous exam.  He was then placed in a beach chair position and his shoulder  and arm was prepped using sterile DuraPrep and draped using sterile technique.  A timeout procedure was called and the correct right shoulder identified.  Initially through a posterior arthroscopic portal, the arthroscope with a pump attached was placed  and through an anterior portal, an arthroscopic probe was placed.  On initial inspection, the articular cartilage in the glenohumeral joint was intact.  He had partial tearing of the anterior, superior and posterior labrum 25%, which was debrided.  The  anterior inferior labrum and anterior inferior glenohumeral ligament complex was intact.  The biceps tendon anchor was torn and the biceps tendon was partially torn 50% and this was felt to be amenable to a biceps tenodesis.  The rotator cuff was  inspected.  He had a complete tear of the supraspinatus and the infraspinatus, partial tear of the superior fibers of the subscapularis.  This was partially debrided.  The teres minor was intact.  Inferior capsular recess was free of pathology.  At this  point then the biceps tenodesis was carried out using the loop and tack technique with the FiberWire placed through the biceps tendon and locked into position and then the medial aspect of the biceps was cut and then the superior labrum was debrided down  to a stable base and rim.  The tenodesis was then carried out with the Arthrex SwiveLock anchor, which was placed in the intraarticular portion of the bicipital groove with firm and tight fixation.  At this point, then the subscapularis tear, the  superior aspect was repaired with  a SwiveLock anchor in an anatomic position on the lesser tuberosity in the position on the humerus with a FiberTape sutures.  After this was done, subacromial space was entered and a lateral arthroscopic portal was  made.   Moderate bursitis was resected.  Impingement was noted as the undersurface of the acromion was digging into the rotator cuff tear and subacromial decompression was carried out removing 6-8 mm of the undersurface of the anterior, anterolateral and  anteromedial acromion and CA ligament release carried out as well.  [facility] was not pathologic and thus was not resected.  At this point, through two accessory lateral portals, the large rotator cuff tear was repaired with two medial row SwiveLock  anchors and two lateral row SwiveLock anchors.  The medial row anchors had the sutures placed through the rotator cuff tear in a mattress suture technique and then tied down medially and then the FiberTape sutures were passed through the lateral row  anchors in a crisscross maneuver and placed in the lateral aspect of the greater tuberosity with firm and tight fixation.  Anatomic reduction and excellent fixation was achieved of the rotator cuff repair.  Shoulder could be brought through a full range  of motion with no impingement on the repair.  At this point, it is felt that all pathology had been satisfactorily addressed.  The instruments were removed.  Portals closed with 3-0 nylon suture.  Sterile dressings and a sling applied and the patient  awakened and taken to recovery room in stable condition.  FOLLOWUP CARE:  The patient will be followed as an outpatient on oxycodone and Flexeril and an abduction sling.  He will be seen back in the office in a week for sutures out and followup.  TN/NUANCE  D:10/03/2018 T:10/03/2018 JOB:[id number]

## 2018-11-13 ENCOUNTER — Other Ambulatory Visit: Payer: Self-pay | Admitting: "Endocrinology

## 2018-11-13 DIAGNOSIS — E782 Mixed hyperlipidemia: Secondary | ICD-10-CM

## 2018-11-14 ENCOUNTER — Other Ambulatory Visit: Payer: Self-pay | Admitting: "Endocrinology

## 2018-11-14 DIAGNOSIS — E782 Mixed hyperlipidemia: Secondary | ICD-10-CM

## 2018-11-15 ENCOUNTER — Ambulatory Visit: Payer: BC Managed Care – PPO | Admitting: "Endocrinology

## 2018-11-15 ENCOUNTER — Other Ambulatory Visit: Payer: Self-pay

## 2018-11-15 ENCOUNTER — Encounter: Payer: Self-pay | Admitting: "Endocrinology

## 2018-11-15 VITALS — BP 133/92 | HR 76 | Temp 97.9°F | Ht 74.0 in | Wt 276.6 lb

## 2018-11-15 DIAGNOSIS — E1165 Type 2 diabetes mellitus with hyperglycemia: Secondary | ICD-10-CM | POA: Diagnosis not present

## 2018-11-15 DIAGNOSIS — E782 Mixed hyperlipidemia: Secondary | ICD-10-CM

## 2018-11-15 DIAGNOSIS — E118 Type 2 diabetes mellitus with unspecified complications: Secondary | ICD-10-CM

## 2018-11-15 DIAGNOSIS — I10 Essential (primary) hypertension: Secondary | ICD-10-CM

## 2018-11-15 DIAGNOSIS — IMO0002 Reserved for concepts with insufficient information to code with codable children: Secondary | ICD-10-CM

## 2018-11-15 LAB — POCT GLYCOSYLATED HEMOGLOBIN (HGB A1C): Hemoglobin A1C: 8.7 % — AB (ref 4.0–5.6)

## 2018-11-15 MED ORDER — TRESIBA FLEXTOUCH 100 UNIT/ML ~~LOC~~ SOPN: 40.0000 [IU] | PEN_INJECTOR | Freq: Every day | SUBCUTANEOUS | 2 refills | Status: DC

## 2018-11-15 NOTE — Patient Instructions (Signed)

## 2018-11-15 NOTE — Progress Notes (Signed)
Endocrinology follow-up note   Subjective:    Patient ID: Preston Mack, male    DOB: Feb 28, 1965,    Past Medical History:  Diagnosis Date  . Diabetes mellitus, type II (HCC)   . Hyperlipidemia   . Sleep apnea    no cpap in ten years  . Traumatic rotator cuff tear, right, initial encounter 07/24/2018   Past Surgical History:  Procedure Laterality Date  . BICEPT TENODESIS Right 10/02/2018   Procedure: BICEPS TENODESIS;  Surgeon: Salvatore Marvel, MD;  Location: [facility];  Service: Orthopedics;  Laterality: Right;  . Lipoma removal    . Osteophyte of bone    . SHOULDER ARTHROSCOPY WITH SUBACROMIAL DECOMPRESSION, ROTATOR CUFF REPAIR AND BICEP TENDON REPAIR Right 10/02/2018   Procedure: RIGHT SHOULDER ARTHROSCOPY DEBRIDEMENT,  WITH SUBACROMIAL DECOMPRESSION,  DISTAL CLAVICAL EXCISION, ROTATOR CUFF REPAIR AND  BICEP TENODESIS;  Surgeon: Salvatore Marvel, MD;  Location: [facility];  Service: Orthopedics;  Laterality: Right;  PRE/POST OP SCALENE   Social History   Socioeconomic History  . Marital status: Married    Spouse name: Not on file  . Number of children: Not on file  . Years of education: Not on file  . Highest education level: Not on file  Occupational History  . Not on file  Social Needs  . Financial resource strain: Not on file  . Food insecurity    Worry: Not on file    Inability: Not on file  . Transportation needs    Medical: Not on file    Non-medical: Not on file  Tobacco Use  . Smoking status: Former Games developer  . Smokeless tobacco: Never Used  Substance and Sexual Activity  . Alcohol use: Yes    Alcohol/week: 0.0 standard drinks    Comment: occas  . Drug use: No  . Sexual activity: Not on file  Lifestyle  . Physical activity    Days per week: Not on file    Minutes per session: Not on file  . Stress: Not on file  Relationships  . Social Musician on phone: Not on file    Gets together: Not on file    Attends  religious service: Not on file    Active member of club or organization: Not on file    Attends meetings of clubs or organizations: Not on file    Relationship status: Not on file  Other Topics Concern  . Not on file  Social History Narrative  . Not on file   Outpatient Encounter Medications as of 11/15/2018  Medication Sig  . zolpidem (AMBIEN) 5 MG tablet Take 5 mg by mouth at bedtime as needed for sleep.  . B-D ULTRAFINE III SHORT PEN 31G X 8 MM MISC USE AS DIRECTED  . glucose blood (TRUE METRIX BLOOD GLUCOSE TEST) test strip Test BG 2 x daily. e11.65  . insulin degludec (TRESIBA FLEXTOUCH) 100 UNIT/ML SOPN FlexTouch Pen Inject 0.4 mLs (40 Units total) into the skin at bedtime.  Marland Kitchen lisinopril (PRINIVIL,ZESTRIL) 20 MG tablet TAKE 1 TABLET BY MOUTH ONCE DAILY.  . simvastatin (ZOCOR) 40 MG tablet TAKE 1 TABLET BY MOUTH ONCE DAILY IN THE EVENING  . sitaGLIPtin-metformin (JANUMET) 50-1000 MG tablet TAKE (1) TABLET TWICE A DAY WITH FOOD---BREAKFAST AND SUPPER.  . [DISCONTINUED] cyclobenzaprine (FLEXERIL) 5 MG tablet Take 1 tablet (5 mg total) by mouth 3 (three) times daily as needed for muscle spasms. (Patient not taking: Reported on 11/15/2018)  . [DISCONTINUED]  insulin degludec (TRESIBA FLEXTOUCH) 100 UNIT/ML SOPN FlexTouch Pen Inject 0.15 mLs (15 Units total) into the skin daily at 10 pm.  . [DISCONTINUED] oxyCODONE (OXY IR/ROXICODONE) 5 MG immediate release tablet Take 1 tablet (5 mg total) by mouth every 4 (four) hours as needed for severe pain. (Patient not taking: Reported on 11/15/2018)   No facility-administered encounter medications on file as of 11/15/2018.    ALLERGIES: No Known Allergies VACCINATION STATUS:  There is no immunization history on file for this patient.  Diabetes He presents for his follow-up diabetic visit. He has type 2 diabetes mellitus. Onset time: He was diagnosed at approximate age of 37 years. His disease course has been worsening. There are no hypoglycemic  associated symptoms. Pertinent negatives for hypoglycemia include no confusion, headaches, pallor or seizures. Associated symptoms include polydipsia and polyuria. Pertinent negatives for diabetes include no chest pain, no fatigue, no polyphagia and no weakness. There are no hypoglycemic complications. Symptoms are worsening. There are no diabetic complications. Risk factors for coronary artery disease include diabetes mellitus, dyslipidemia, hypertension, family history, obesity, sedentary lifestyle, tobacco exposure and male sex. Current diabetic treatment includes oral agent (dual therapy). He is compliant with treatment most of the time. His weight is fluctuating minimally. He is following a generally unhealthy diet. He has had a previous visit with a dietitian. He participates in exercise intermittently. (He does not monitor blood glucose regularly after his fall and injury to his right shoulder.  He is point-of-care A1c was 8.7% today increasing from 6.1% during his last visit.) An ACE inhibitor/angiotensin II receptor blocker is being taken. Eye exam is current.  Hyperlipidemia This is a chronic problem. The current episode started more than 1 year ago. The problem is controlled. Exacerbating diseases include diabetes and obesity. Pertinent negatives include no chest pain, myalgias or shortness of breath. Current antihyperlipidemic treatment includes statins. Risk factors for coronary artery disease include diabetes mellitus, dyslipidemia, hypertension, male sex, a sedentary lifestyle and obesity.  Hypertension This is a chronic problem. The current episode started more than 1 year ago. The problem is controlled. Pertinent negatives include no chest pain, headaches, neck pain, palpitations or shortness of breath. Risk factors for coronary artery disease include dyslipidemia, diabetes mellitus, obesity, sedentary lifestyle and male gender. Past treatments include ACE inhibitors.     Review of Systems   Constitutional: Negative for fatigue and unexpected weight change.  HENT: Negative for dental problem, mouth sores and trouble swallowing.   Eyes: Negative for visual disturbance.  Respiratory: Negative for cough, choking, chest tightness, shortness of breath and wheezing.   Cardiovascular: Negative for chest pain, palpitations and leg swelling.  Gastrointestinal: Negative for abdominal distention, abdominal pain, constipation, diarrhea, nausea and vomiting.  Endocrine: Positive for polydipsia and polyuria. Negative for polyphagia.  Genitourinary: Negative for dysuria, flank pain, hematuria and urgency.  Musculoskeletal: Negative for back pain, gait problem, myalgias and neck pain.  Skin: Negative for pallor, rash and wound.  Neurological: Negative for seizures, syncope, weakness, numbness and headaches.  Psychiatric/Behavioral: Negative.  Negative for confusion and dysphoric mood.    Objective:    BP (!) 133/92 (BP Location: Left Arm, Patient Position: Sitting, Cuff Size: Normal)   Pulse 76   Temp 97.9 F (36.6 C) (Oral)   Ht 6\' 2"  (1.88 m)   Wt 276 lb 9.6 oz (125.5 kg)   SpO2 99%   BMI 35.51 kg/m   Wt Readings from Last 3 Encounters:  11/15/18 276 lb 9.6 oz (125.5 kg)  10/02/18 274 lb 11.1 oz (124.6 kg)  09/19/18 279 lb (126.6 kg)    Physical Exam  Constitutional: He is oriented to person, place, and time. He appears well-developed. He is cooperative. No distress.  HENT:  Head: Normocephalic and atraumatic.  Eyes: EOM are normal.  Neck: Normal range of motion. Neck supple. No tracheal deviation present. No thyromegaly present.  Cardiovascular: Normal rate, S1 normal and S2 normal. Exam reveals no gallop.  No murmur heard. Pulses:      Dorsalis pedis pulses are 1+ on the right side and 1+ on the left side.       Posterior tibial pulses are 1+ on the right side and 1+ on the left side.  Pulmonary/Chest: Effort normal. No respiratory distress. He has no wheezes.  Abdominal:  He exhibits no distension. There is no abdominal tenderness. There is no guarding and no CVA tenderness.  Musculoskeletal:        General: No edema.     Right shoulder: He exhibits no swelling and no deformity.  Neurological: He is alert and oriented to person, place, and time. He has normal strength and normal reflexes. No cranial nerve deficit or sensory deficit. Gait normal.  Skin: Skin is warm and dry. No rash noted. No cyanosis. Nails show no clubbing.  Psychiatric: He has a normal mood and affect. His speech is normal. Judgment normal. Cognition and memory are normal.    Results for orders placed or performed in visit on 11/15/18  HgB A1c  Result Value Ref Range   Hemoglobin A1C 8.7 (A) 4.0 - 5.6 %   HbA1c POC (<> result, manual entry)     HbA1c, POC (prediabetic range)     HbA1c, POC (controlled diabetic range)     Complete Blood Count (Most recent): Lab Results  Component Value Date   WBC 8.8 08/29/2018   HGB 16.6 08/29/2018   HCT 48.0 08/29/2018   MCV 88.2 08/29/2018   PLT PLATELET CLUMPS NOTED ON SMEAR, UNABLE TO ESTIMATE 08/29/2018   Chemistry (most recent): Lab Results  Component Value Date   NA 137 09/27/2018   K 4.1 09/27/2018   CL 104 09/27/2018   CO2 23 09/27/2018   BUN 14 09/27/2018   CREATININE 0.83 09/27/2018   Diabetic Labs (most recent): Lab Results  Component Value Date   HGBA1C 8.7 (A) 11/15/2018   HGBA1C 6.8 (H) 05/16/2018   HGBA1C 6.1 (H) 11/02/2017   Lipid Panel     Component Value Date/Time   CHOL 122 02/12/2016 0834   TRIG 51 02/12/2016 0834   HDL 31 (L) 02/12/2016 0834   CHOLHDL 3.9 02/12/2016 0834   VLDL 10 02/12/2016 0834   LDLCALC 81 02/12/2016 0834     Assessment & Plan:   1. Uncontrolled type 2 diabetes mellitus with complication, without long-term current use of insulin ([facility])  -He missed his appointment since October 2019. -He presents with loss of control of diabetes with A1c of 8.7% increasing from 6.1%.  After his  recent fall and injury to his right shoulder, he has not been testing blood glucose regularly.    -  he remains at a high risk for more acute and chronic complications of diabetes which include CAD, CVA, CKD, retinopathy, and neuropathy. These are all discussed in detail with the patient.   Recent labs reviewed.   - I have re-counseled the patient on diet management and weight loss  by adopting a carbohydrate restricted / protein rich  Diet.  - he  admits there is a room for improvement in his diet and drink choices. -  Suggestion is made for him to avoid simple carbohydrates  from his diet including Cakes, Sweet Desserts / Pastries, Ice Cream, Soda (diet and regular), Sweet Tea, Candies, Chips, Cookies, Sweet Pastries,  Store Bought Juices, Alcohol in Excess of  1-2 drinks a day, Artificial Sweeteners, Coffee Creamer, and "Sugar-free" Products. This will help patient to have stable blood glucose profile and potentially avoid unintended weight gain.   - Patient is advised to stick to a routine mealtimes to eat 3 meals  a day and avoid unnecessary snacks ( to snack only to correct hypoglycemia).  - He is  approached to to increase his [place] to 40 units nightly, start monitoring blood glucose at least 2 times a day-daily before breakfast and at bedtime.   -He is encouraged to call clinic if he registers less than 70 or greater than 200 x 3. -He is advised to continue Janumet 50/1000 mg p.o. twice daily- after breakfast and after supper.     - Patient specific target  for A1c; LDL, HDL, Triglycerides, and  Waist Circumference were discussed in detail.  2) BP/HTN: His blood pressure is not controlled to target this morning.  He is advised to continue his current blood pressure medication involving lisinopril 20 mg p.o. daily with breakfast.     3) Lipids/HPL: His lipid panel is controlled with LDL of 81.  He is advised to continue simvastatin 40 mg p.o. nightly.    4)  Weight/Diet: CDE  consult in progress, exercise, and carbohydrates information provided.  5) Chronic Care/Health Maintenance:  -Patient is on ACEI/ARB and Statin medications and encouraged to continue to follow up with Ophthalmology, Podiatrist at least yearly or according to recommendations, and advised to  stay away from smoking. I have recommended yearly flu vaccine and pneumonia vaccination at least every 5 years; moderate intensity exercise for up to 150 minutes weekly; and  sleep for at least 7 hours a day.  I advised patient to maintain close follow up with his PCP for primary care needs.  - Time spent with the patient: 25 min, of which >50% was spent in reviewing his  current and  previous labs/studies, previous treatments, and medications doses and developing a plan for long-term care based on the latest recommendations for standards of care. Please refer to " Patient Self Inventory" in the Media  tab for reviewed elements of pertinent patient history.  Preston Mack participated in the discussions, expressed understanding, and voiced agreement with the above plans.  All questions were answered to his satisfaction. he is encouraged to contact clinic should he have any questions or concerns prior to his return visit.   Follow up plan: Return in about 4 months (around 03/18/2019) for Next Visit A1c in Office.  Marquis Lunch, MD Phone: [phone]  Fax: [phone]  This note was partially dictated with voice recognition software. Similar sounding words can be transcribed inadequately or may not  be corrected upon review.  11/15/2018, 11:55 AM

## 2018-12-09 ENCOUNTER — Other Ambulatory Visit: Payer: Self-pay | Admitting: "Endocrinology

## 2018-12-09 DIAGNOSIS — IMO0002 Reserved for concepts with insufficient information to code with codable children: Secondary | ICD-10-CM

## 2018-12-09 DIAGNOSIS — E1165 Type 2 diabetes mellitus with hyperglycemia: Secondary | ICD-10-CM

## 2018-12-26 ENCOUNTER — Other Ambulatory Visit: Payer: Self-pay

## 2018-12-26 MED ORDER — TRESIBA FLEXTOUCH 100 UNIT/ML ~~LOC~~ SOPN: 40.0000 [IU] | PEN_INJECTOR | Freq: Every day | SUBCUTANEOUS | 2 refills | Status: DC

## 2019-01-07 ENCOUNTER — Other Ambulatory Visit: Payer: Self-pay | Admitting: "Endocrinology

## 2019-01-07 DIAGNOSIS — E782 Mixed hyperlipidemia: Secondary | ICD-10-CM

## 2019-01-07 DIAGNOSIS — I10 Essential (primary) hypertension: Secondary | ICD-10-CM

## 2019-03-04 ENCOUNTER — Other Ambulatory Visit: Payer: Self-pay | Admitting: "Endocrinology

## 2019-03-14 ENCOUNTER — Other Ambulatory Visit: Payer: Self-pay | Admitting: "Endocrinology

## 2019-03-14 DIAGNOSIS — E1165 Type 2 diabetes mellitus with hyperglycemia: Secondary | ICD-10-CM

## 2019-03-14 DIAGNOSIS — IMO0002 Reserved for concepts with insufficient information to code with codable children: Secondary | ICD-10-CM

## 2019-03-19 ENCOUNTER — Other Ambulatory Visit: Payer: Self-pay

## 2019-03-19 ENCOUNTER — Encounter: Payer: Self-pay | Admitting: "Endocrinology

## 2019-03-19 ENCOUNTER — Ambulatory Visit: Payer: BC Managed Care – PPO | Admitting: "Endocrinology

## 2019-03-19 VITALS — BP 150/84 | HR 74 | Ht 74.0 in | Wt 282.8 lb

## 2019-03-19 DIAGNOSIS — E118 Type 2 diabetes mellitus with unspecified complications: Secondary | ICD-10-CM | POA: Diagnosis not present

## 2019-03-19 DIAGNOSIS — IMO0002 Reserved for concepts with insufficient information to code with codable children: Secondary | ICD-10-CM

## 2019-03-19 DIAGNOSIS — I10 Essential (primary) hypertension: Secondary | ICD-10-CM | POA: Diagnosis not present

## 2019-03-19 DIAGNOSIS — E1165 Type 2 diabetes mellitus with hyperglycemia: Secondary | ICD-10-CM | POA: Diagnosis not present

## 2019-03-19 DIAGNOSIS — E782 Mixed hyperlipidemia: Secondary | ICD-10-CM

## 2019-03-19 LAB — POCT GLYCOSYLATED HEMOGLOBIN (HGB A1C): Hemoglobin A1C: 8.5 % — AB (ref 4.0–5.6)

## 2019-03-19 MED ORDER — TRESIBA FLEXTOUCH 100 UNIT/ML ~~LOC~~ SOPN: 50.0000 [IU] | PEN_INJECTOR | Freq: Every day | SUBCUTANEOUS | 2 refills | Status: DC

## 2019-03-19 NOTE — Patient Instructions (Signed)

## 2019-03-19 NOTE — Progress Notes (Signed)
03/19/2019  Endocrinology follow-up note   Subjective:    Patient ID: Preston Mack, male    DOB: Apr 20, 1965,    Past Medical History:  Diagnosis Date  . Diabetes mellitus, type II (HCC)   . Hyperlipidemia   . Sleep apnea    no cpap in ten years  . Traumatic rotator cuff tear, right, initial encounter 07/24/2018   Past Surgical History:  Procedure Laterality Date  . BICEPT TENODESIS Right 10/02/2018   Procedure: BICEPS TENODESIS;  Surgeon: Salvatore Marvel, MD;  Location: [facility];  Service: Orthopedics;  Laterality: Right;  . Lipoma removal    . Osteophyte of bone    . SHOULDER ARTHROSCOPY WITH SUBACROMIAL DECOMPRESSION, ROTATOR CUFF REPAIR AND BICEP TENDON REPAIR Right 10/02/2018   Procedure: RIGHT SHOULDER ARTHROSCOPY DEBRIDEMENT,  WITH SUBACROMIAL DECOMPRESSION,  DISTAL CLAVICAL EXCISION, ROTATOR CUFF REPAIR AND  BICEP TENODESIS;  Surgeon: Salvatore Marvel, MD;  Location:  SURGERY CENTER;  Service: Orthopedics;  Laterality: Right;  PRE/POST OP SCALENE   Social History   Socioeconomic History  . Marital status: Married    Spouse name: Not on file  . Number of children: Not on file  . Years of education: Not on file  . Highest education level: Not on file  Occupational History  . Not on file  Tobacco Use  . Smoking status: Former Games developer  . Smokeless tobacco: Never Used  Substance and Sexual Activity  . Alcohol use: Yes    Alcohol/week: 0.0 standard drinks    Comment: occas  . Drug use: No  . Sexual activity: Not on file  Other Topics Concern  . Not on file  Social History Narrative  . Not on file   Social Determinants of Health   Financial Resource Strain:   . Difficulty of Paying Living Expenses: Not on file  Food Insecurity:   . Worried About Programme researcher, broadcasting/film/video in the Last Year: Not on file  . Ran Out of Food in the Last Year: Not on file  Transportation Needs:   . Lack of Transportation (Medical): Not on file  . Lack of  Transportation (Non-Medical): Not on file  Physical Activity:   . Days of Exercise per Week: Not on file  . Minutes of Exercise per Session: Not on file  Stress:   . Feeling of Stress : Not on file  Social Connections:   . Frequency of Communication with Friends and Family: Not on file  . Frequency of Social Gatherings with Friends and Family: Not on file  . Attends Religious Services: Not on file  . Active Member of Clubs or Organizations: Not on file  . Attends Banker Meetings: Not on file  . Marital Status: Not on file   Outpatient Encounter Medications as of 03/19/2019  Medication Sig  . B-D ULTRAFINE III SHORT PEN 31G X 8 MM MISC USE AS DIRECTED  . glucose blood (TRUE METRIX BLOOD GLUCOSE TEST) test strip Test BG 2 x daily. e11.65  . insulin degludec (TRESIBA FLEXTOUCH) 100 UNIT/ML SOPN FlexTouch Pen Inject 0.5 mLs (50 Units total) into the skin at bedtime.  Marland Kitchen JANUMET 50-1000 MG tablet TAKE (1) TABLET TWICE A DAY WITH FOOD---BREAKFAST AND SUPPER.  Marland Kitchen lisinopril (ZESTRIL) 20 MG tablet TAKE 1 TABLET BY MOUTH ONCE DAILY.  . simvastatin (ZOCOR) 40 MG tablet TAKE 1 TABLET BY MOUTH ONCE DAILY IN THE EVENING  . [DISCONTINUED] insulin degludec (TRESIBA FLEXTOUCH) 100 UNIT/ML SOPN FlexTouch Pen Inject 0.4 mLs (40  Units total) into the skin at bedtime.  . [DISCONTINUED] zolpidem (AMBIEN) 5 MG tablet Take 5 mg by mouth at bedtime as needed for sleep.   No facility-administered encounter medications on file as of 03/19/2019.   ALLERGIES: No Known Allergies VACCINATION STATUS:  There is no immunization history on file for this patient.  Diabetes He presents for his follow-up diabetic visit. He has type 2 diabetes mellitus. Onset time: He was diagnosed at approximate age of 60 years. His disease course has been fluctuating. There are no hypoglycemic associated symptoms. Pertinent negatives for hypoglycemia include no confusion, headaches, pallor or seizures. Associated symptoms  include polydipsia and polyuria. Pertinent negatives for diabetes include no chest pain, no fatigue, no polyphagia and no weakness. There are no hypoglycemic complications. Symptoms are worsening. There are no diabetic complications. Risk factors for coronary artery disease include diabetes mellitus, dyslipidemia, hypertension, family history, obesity, sedentary lifestyle, tobacco exposure and male sex. Current diabetic treatment includes oral agent (dual therapy) (He is currently on Tresiba 40 units, Janumet 50/1000 mg p.o. twice daily.). He is compliant with treatment most of the time. His weight is increasing steadily. He is following a generally unhealthy diet. When asked about meal planning, he reported none. He has had a previous visit with a dietitian. He participates in exercise intermittently. (He did not bring his meter nor logs.  He reports lowest blood glucose at 110.  He denies hyperglycemia above 200 mg per DL.   -He is point-of-care A1c is 8.5%, slightly improving from 8.7%.  ) An ACE inhibitor/angiotensin II receptor blocker is being taken. Eye exam is current.  Hyperlipidemia This is a chronic problem. The current episode started more than 1 year ago. The problem is controlled. Exacerbating diseases include diabetes and obesity. Pertinent negatives include no chest pain, myalgias or shortness of breath. Current antihyperlipidemic treatment includes statins. Risk factors for coronary artery disease include diabetes mellitus, dyslipidemia, hypertension, male sex, a sedentary lifestyle and obesity.  Hypertension This is a chronic problem. The current episode started more than 1 year ago. The problem is controlled. Pertinent negatives include no chest pain, headaches, neck pain, palpitations or shortness of breath. Risk factors for coronary artery disease include dyslipidemia, diabetes mellitus, obesity, sedentary lifestyle and male gender. Past treatments include ACE inhibitors.     Review of  Systems  Constitutional: Negative for fatigue and unexpected weight change.  HENT: Negative for dental problem, mouth sores and trouble swallowing.   Eyes: Negative for visual disturbance.  Respiratory: Negative for cough, choking, chest tightness, shortness of breath and wheezing.   Cardiovascular: Negative for chest pain, palpitations and leg swelling.  Gastrointestinal: Negative for abdominal distention, abdominal pain, constipation, diarrhea, nausea and vomiting.  Endocrine: Positive for polydipsia and polyuria. Negative for polyphagia.  Genitourinary: Negative for dysuria, flank pain, hematuria and urgency.  Musculoskeletal: Negative for back pain, gait problem, myalgias and neck pain.  Skin: Negative for pallor, rash and wound.  Neurological: Negative for seizures, syncope, weakness, numbness and headaches.  Psychiatric/Behavioral: Negative.  Negative for confusion and dysphoric mood.    Objective:    BP (!) 150/84   Pulse 74   Ht 6\' 2"  (1.88 m)   Wt 282 lb 12.8 oz (128.3 kg)   BMI 36.31 kg/m   Wt Readings from Last 3 Encounters:  03/19/19 282 lb 12.8 oz (128.3 kg)  11/15/18 276 lb 9.6 oz (125.5 kg)  10/02/18 274 lb 11.1 oz (124.6 kg)    Physical Exam  Constitutional: He is  oriented to person, place, and time. He appears well-developed. He is cooperative. No distress.  HENT:  Head: Normocephalic and atraumatic.  Eyes: EOM are normal.  Neck: No tracheal deviation present. No thyromegaly present.  Cardiovascular: Normal rate, S1 normal and S2 normal. Exam reveals no gallop.  No murmur heard. Pulses:      Dorsalis pedis pulses are 1+ on the right side and 1+ on the left side.       Posterior tibial pulses are 1+ on the right side and 1+ on the left side.  Pulmonary/Chest: Effort normal. No respiratory distress. He has no wheezes.  Abdominal: He exhibits no distension. There is no abdominal tenderness. There is no guarding and no CVA tenderness.  Musculoskeletal:         General: No edema.     Right shoulder: No swelling or deformity.     Cervical back: Normal range of motion and neck supple.  Neurological: He is alert and oriented to person, place, and time. He has normal strength and normal reflexes. No cranial nerve deficit or sensory deficit. Gait normal.  Skin: Skin is warm and dry. No rash noted. No cyanosis. Nails show no clubbing.  Psychiatric: He has a normal mood and affect. His speech is normal. Judgment normal. Cognition and memory are normal.    Results for orders placed or performed in visit on 03/19/19  HgB A1c  Result Value Ref Range   Hemoglobin A1C 8.5 (A) 4.0 - 5.6 %   HbA1c POC (<> result, manual entry)     HbA1c, POC (prediabetic range)     HbA1c, POC (controlled diabetic range)     Complete Blood Count (Most recent): Lab Results  Component Value Date   WBC 8.8 08/29/2018   HGB 16.6 08/29/2018   HCT 48.0 08/29/2018   MCV 88.2 08/29/2018   PLT PLATELET CLUMPS NOTED ON SMEAR, UNABLE TO ESTIMATE 08/29/2018   Chemistry (most recent): Lab Results  Component Value Date   NA 137 09/27/2018   K 4.1 09/27/2018   CL 104 09/27/2018   CO2 23 09/27/2018   BUN 14 09/27/2018   CREATININE 0.83 09/27/2018   Diabetic Labs (most recent): Lab Results  Component Value Date   HGBA1C 8.5 (A) 03/19/2019   HGBA1C 8.7 (A) 11/15/2018   HGBA1C 6.8 (H) 05/16/2018   Lipid Panel     Component Value Date/Time   CHOL 122 02/12/2016 0834   TRIG 51 02/12/2016 0834   HDL 31 (L) 02/12/2016 0834   CHOLHDL 3.9 02/12/2016 0834   VLDL 10 02/12/2016 0834   LDLCALC 81 02/12/2016 0834     Assessment & Plan:   1. Uncontrolled type 2 diabetes mellitus with complication, without long-term current use of insulin (HCC)  -He presents with inadequate improvement with A1c of 8.5%.  He still has significant above target glycemic profile.   - he has not been testing blood glucose regularly, citing problems with his bilateral shoulders.  -  he remains at a  high risk for more acute and chronic complications of diabetes which include CAD, CVA, CKD, retinopathy, and neuropathy. These are all discussed in detail with the patient.   Recent labs reviewed.   - I have re-counseled the patient on diet management and weight loss  by adopting a carbohydrate restricted / protein rich  Diet.  - he  admits there is a room for improvement in his diet and drink choices. -  Suggestion is made for him to avoid simple carbohydrates  from his diet including Cakes, Sweet Desserts / Pastries, Ice Cream, Soda (diet and regular), Sweet Tea, Candies, Chips, Cookies, Sweet Pastries,  Store Bought Juices, Alcohol in Excess of  1-2 drinks a day, Artificial Sweeteners, Coffee Creamer, and "Sugar-free" Products. This will help patient to have stable blood glucose profile and potentially avoid unintended weight gain.  - Patient is advised to stick to a routine mealtimes to eat 3 meals  a day and avoid unnecessary snacks ( to snack only to correct hypoglycemia).  - He will need a higher dose of insulin in order for him to achieve control of diabetes to target.  He is advised to increase his [place] to 50  units nightly, start monitoring blood glucose at least 2 times a day-daily before breakfast and at bedtime.   -He is encouraged to call clinic if he registers less than 70 or greater than 150 x 3. -He is advised to continue Janumet 50/1000 mg p.o. twice daily- after breakfast and after supper.     - Patient specific target  for A1c; LDL, HDL, Triglycerides, and  Waist Circumference were discussed in detail.  2) BP/HTN: His blood pressure is not controlled to target.   He is advised to continue his current blood pressure medication involving lisinopril 20 mg p.o. daily with breakfast.     3) Lipids/HPL: His lipid panel is controlled with LDL of 81.  Simvastatin 40 mg p.o. nightly.   4)  Weight/Diet: His BMI is 35-clearly candidate for modest weight loss.  CDE consult in  progress, exercise, and carbohydrates information provided.  5) Chronic Care/Health Maintenance:  -Patient is on ACEI/ARB and Statin medications and encouraged to continue to follow up with Ophthalmology, Podiatrist at least yearly or according to recommendations, and advised to  stay away from smoking. I have recommended yearly flu vaccine and pneumonia vaccination at least every 5 years; moderate intensity exercise for up to 150 minutes weekly; and  sleep for at least 7 hours a day.  I advised patient to maintain close follow up with his PCP for primary care needs.  - Time spent on this patient care encounter:  35 min, of which > 50% was spent in  counseling and the rest reviewing his blood glucose logs , discussing his hypoglycemia and hyperglycemia episodes, reviewing his current and  previous labs / studies  ( including abstraction from other facilities) and medications  doses and developing a  long term treatment plan and documenting his care.   Please refer to Patient Instructions for Blood Glucose Monitoring and Insulin/Medications Dosing Guide"  in media tab for additional information. Please  also refer to " Patient Self Inventory" in the Media  tab for reviewed elements of pertinent patient history.  Preston Mack participated in the discussions, expressed understanding, and voiced agreement with the above plans.  All questions were answered to his satisfaction. he is encouraged to contact clinic should he have any questions or concerns prior to his return visit.   Follow up plan: Return in about 4 months (around 07/17/2019) for Bring Meter and Logs- A1c in Office, Follow up with Pre-visit Labs.  Marquis Lunch, MD Phone: [phone]  Fax: [phone]  This note was partially dictated with voice recognition software. Similar sounding words can be transcribed inadequately or may not  be corrected upon review.  03/19/2019, 8:25 PM

## 2019-03-21 ENCOUNTER — Other Ambulatory Visit: Payer: Self-pay | Admitting: Orthopedic Surgery

## 2019-03-21 DIAGNOSIS — M25512 Pain in left shoulder: Secondary | ICD-10-CM

## 2019-03-21 DIAGNOSIS — M75102 Unspecified rotator cuff tear or rupture of left shoulder, not specified as traumatic: Secondary | ICD-10-CM

## 2019-04-04 ENCOUNTER — Ambulatory Visit
Admission: RE | Admit: 2019-04-04 | Discharge: 2019-04-04 | Disposition: A | Discharge status: Home / Self Care | Payer: BC Managed Care – PPO | Source: Ambulatory Visit | Attending: Orthopedic Surgery | Admitting: Orthopedic Surgery

## 2019-04-04 ENCOUNTER — Other Ambulatory Visit: Payer: Self-pay

## 2019-04-04 DIAGNOSIS — M25512 Pain in left shoulder: Secondary | ICD-10-CM

## 2019-04-04 DIAGNOSIS — M75102 Unspecified rotator cuff tear or rupture of left shoulder, not specified as traumatic: Secondary | ICD-10-CM

## 2019-04-25 ENCOUNTER — Other Ambulatory Visit: Payer: Self-pay | Admitting: "Endocrinology

## 2019-04-25 DIAGNOSIS — E782 Mixed hyperlipidemia: Secondary | ICD-10-CM

## 2019-04-25 DIAGNOSIS — I10 Essential (primary) hypertension: Secondary | ICD-10-CM

## 2019-06-16 ENCOUNTER — Other Ambulatory Visit: Payer: Self-pay | Admitting: "Endocrinology

## 2019-06-16 DIAGNOSIS — IMO0002 Reserved for concepts with insufficient information to code with codable children: Secondary | ICD-10-CM

## 2019-06-16 DIAGNOSIS — E1165 Type 2 diabetes mellitus with hyperglycemia: Secondary | ICD-10-CM

## 2019-07-17 ENCOUNTER — Ambulatory Visit: Payer: BC Managed Care – PPO | Admitting: "Endocrinology

## 2019-07-19 LAB — COMPLETE METABOLIC PANEL WITH GFR
AG Ratio: 2.3 (calc) (ref 1.0–2.5)
ALT: 23 U/L (ref 9–46)
AST: 17 U/L (ref 10–35)
Albumin: 4.4 g/dL (ref 3.6–5.1)
Alkaline phosphatase (APISO): 36 U/L (ref 35–144)
BUN: 21 mg/dL (ref 7–25)
CO2: 27 mmol/L (ref 20–32)
Calcium: 9.6 mg/dL (ref 8.6–10.3)
Chloride: 107 mmol/L (ref 98–110)
Creat: 0.82 mg/dL (ref 0.70–1.33)
GFR, Est African American: 118 mL/min/{1.73_m2} (ref >=60)
GFR, Est Non African American: 102 mL/min/{1.73_m2} (ref >=60)
Globulin: 1.9 g/dL (calc) (ref 1.9–3.7)
Glucose, Bld: 186 mg/dL — ABNORMAL HIGH (ref 65–99)
Potassium: 4.1 mmol/L (ref 3.5–5.3)
Sodium: 142 mmol/L (ref 135–146)
Total Bilirubin: 0.5 mg/dL (ref 0.2–1.2)
Total Protein: 6.3 g/dL (ref 6.1–8.1)

## 2019-07-19 LAB — LIPID PANEL
Cholesterol: 164 mg/dL (ref <200)
HDL: 30 mg/dL — ABNORMAL LOW (ref >=40)
LDL Cholesterol (Calc): 107 mg/dL (calc) — ABNORMAL HIGH
Non-HDL Cholesterol (Calc): 134 mg/dL (calc) — ABNORMAL HIGH (ref <130)
Total CHOL/HDL Ratio: 5.5 (calc) — ABNORMAL HIGH (ref <5.0)
Triglycerides: 153 mg/dL — ABNORMAL HIGH (ref <150)

## 2019-07-19 LAB — TSH: TSH: 0.8 mIU/L (ref 0.40–4.50)

## 2019-07-19 LAB — T4, FREE: Free T4: 1.1 ng/dL (ref 0.8–1.8)

## 2019-07-19 LAB — MICROALBUMIN / CREATININE URINE RATIO
Creatinine, Urine: 115 mg/dL (ref 20–320)
Microalb Creat Ratio: 4 mcg/mg creat (ref <30)
Microalb, Ur: 0.5 mg/dL

## 2019-07-29 ENCOUNTER — Other Ambulatory Visit: Payer: Self-pay | Admitting: "Endocrinology

## 2019-07-29 DIAGNOSIS — E782 Mixed hyperlipidemia: Secondary | ICD-10-CM

## 2019-07-29 DIAGNOSIS — I10 Essential (primary) hypertension: Secondary | ICD-10-CM

## 2019-08-27 ENCOUNTER — Other Ambulatory Visit: Payer: Self-pay

## 2019-08-27 ENCOUNTER — Encounter: Payer: Self-pay | Admitting: "Endocrinology

## 2019-08-27 ENCOUNTER — Ambulatory Visit: Payer: BC Managed Care – PPO | Admitting: "Endocrinology

## 2019-08-27 VITALS — BP 138/84 | HR 76 | Ht 74.0 in | Wt 283.2 lb

## 2019-08-27 DIAGNOSIS — I10 Essential (primary) hypertension: Secondary | ICD-10-CM | POA: Diagnosis not present

## 2019-08-27 DIAGNOSIS — Z6836 Body mass index (BMI) 36.0-36.9, adult: Secondary | ICD-10-CM

## 2019-08-27 DIAGNOSIS — E118 Type 2 diabetes mellitus with unspecified complications: Secondary | ICD-10-CM | POA: Diagnosis not present

## 2019-08-27 DIAGNOSIS — IMO0002 Reserved for concepts with insufficient information to code with codable children: Secondary | ICD-10-CM

## 2019-08-27 DIAGNOSIS — E1165 Type 2 diabetes mellitus with hyperglycemia: Secondary | ICD-10-CM

## 2019-08-27 DIAGNOSIS — E782 Mixed hyperlipidemia: Secondary | ICD-10-CM

## 2019-08-27 LAB — POCT GLYCOSYLATED HEMOGLOBIN (HGB A1C): Hemoglobin A1C: 8 % — AB (ref 4.0–5.6)

## 2019-08-27 MED ORDER — TRESIBA FLEXTOUCH 100 UNIT/ML ~~LOC~~ SOPN: 60.0000 [IU] | PEN_INJECTOR | Freq: Every day | SUBCUTANEOUS | 2 refills | Status: DC

## 2019-08-27 NOTE — Patient Instructions (Signed)

## 2019-08-27 NOTE — Progress Notes (Signed)
08/27/2019  Endocrinology follow-up note   Subjective:    Patient ID: Preston Mack, male    DOB: September 30, 1965,    Past Medical History:  Diagnosis Date  . Diabetes mellitus, type II (HCC)   . Hyperlipidemia   . Sleep apnea    no cpap in ten years  . Traumatic rotator cuff tear, right, initial encounter 07/24/2018   Past Surgical History:  Procedure Laterality Date  . BICEPT TENODESIS Right 10/02/2018   Procedure: BICEPS TENODESIS;  Surgeon: Salvatore MarvelWainer, Robert, MD;  Location: [facility];  Service: Orthopedics;  Laterality: Right;  . Lipoma removal    . Osteophyte of bone    . SHOULDER ARTHROSCOPY WITH SUBACROMIAL DECOMPRESSION, ROTATOR CUFF REPAIR AND BICEP TENDON REPAIR Right 10/02/2018   Procedure: RIGHT SHOULDER ARTHROSCOPY DEBRIDEMENT,  WITH SUBACROMIAL DECOMPRESSION,  DISTAL CLAVICAL EXCISION, ROTATOR CUFF REPAIR AND  BICEP TENODESIS;  Surgeon: Salvatore MarvelWainer, Robert, MD;  Location: [facility];  Service: Orthopedics;  Laterality: Right;  PRE/POST OP SCALENE   Social History   Socioeconomic History  . Marital status: Married    Spouse name: Not on file  . Number of children: Not on file  . Years of education: Not on file  . Highest education level: Not on file  Occupational History  . Not on file  Tobacco Use  . Smoking status: Former Games developermoker  . Smokeless tobacco: Never Used  Vaping Use  . Vaping Use: Never used  Substance and Sexual Activity  . Alcohol use: Yes    Alcohol/week: 0.0 standard drinks    Comment: occas  . Drug use: No  . Sexual activity: Not on file  Other Topics Concern  . Not on file  Social History Narrative  . Not on file   Social Determinants of Health   Financial Resource Strain:   . Difficulty of Paying Living Expenses:   Food Insecurity:   . Worried About Programme researcher, broadcasting/film/videounning Out of Food in the Last Year:   . Baristaan Out of Food in the Last Year:   Transportation Needs:   . Freight forwarderLack of Transportation (Medical):   Marland Kitchen. Lack of  Transportation (Non-Medical):   Physical Activity:   . Days of Exercise per Week:   . Minutes of Exercise per Session:   Stress:   . Feeling of Stress :   Social Connections:   . Frequency of Communication with Friends and Family:   . Frequency of Social Gatherings with Friends and Family:   . Attends Religious Services:   . Active Member of Clubs or Organizations:   . Attends BankerClub or Organization Meetings:   Marland Kitchen. Marital Status:    Outpatient Encounter Medications as of 08/27/2019  Medication Sig  . B-D ULTRAFINE III SHORT PEN 31G X 8 MM MISC USE AS DIRECTED  . glucose blood (TRUE METRIX BLOOD GLUCOSE TEST) test strip Test BG 2 x daily. e11.65  . insulin degludec (TRESIBA FLEXTOUCH) 100 UNIT/ML FlexTouch Pen Inject 0.6 mLs (60 Units total) into the skin at bedtime.  Marland Kitchen. JANUMET 50-1000 MG tablet TAKE (1) TABLET TWICE A DAY WITH FOOD---BREAKFAST AND SUPPER.  Marland Kitchen. lisinopril (ZESTRIL) 20 MG tablet TAKE 1 TABLET BY MOUTH ONCE DAILY.  . simvastatin (ZOCOR) 40 MG tablet TAKE 1 TABLET BY MOUTH ONCE DAILY IN THE EVENING  . [DISCONTINUED] TRESIBA FLEXTOUCH 100 UNIT/ML FlexTouch Pen INJECT 0.5MLS (50 UNITS TOTAL) INTO THE SKIN AT BEDTIME.   No facility-administered encounter medications on file as of 08/27/2019.   ALLERGIES: No Known Allergies  VACCINATION STATUS:  There is no immunization history on file for this patient.  Diabetes He presents for his follow-up diabetic visit. He has type 2 diabetes mellitus. Onset time: He was diagnosed at approximate age of 37 years. His disease course has been improving. There are no hypoglycemic associated symptoms. Pertinent negatives for hypoglycemia include no confusion, headaches, pallor or seizures. Pertinent negatives for diabetes include no chest pain, no fatigue, no polydipsia, no polyphagia, no polyuria and no weakness. There are no hypoglycemic complications. Symptoms are improving. There are no diabetic complications. Risk factors for coronary artery  disease include diabetes mellitus, dyslipidemia, hypertension, family history, obesity, sedentary lifestyle, tobacco exposure and male sex. Current diabetic treatment includes oral agent (dual therapy) (He is currently on Tresiba 40 units, Janumet 50/1000 mg p.o. twice daily.). He is compliant with treatment most of the time. His weight is fluctuating minimally. He is following a generally unhealthy diet. When asked about meal planning, he reported none. He has had a previous visit with a dietitian. He participates in exercise intermittently. His home blood glucose trend is decreasing steadily. (He did not bring any logs nor meter to review.  He reports that his blood glucose profile ranges between 140-189.  His point-of-care A1c is 8%, slowly improving from 8.7%.  He denies hypoglycemia.  ) An ACE inhibitor/angiotensin II receptor blocker is being taken. Eye exam is current.  Hyperlipidemia This is a chronic problem. The current episode started more than 1 year ago. The problem is controlled. Exacerbating diseases include diabetes and obesity. Pertinent negatives include no chest pain, myalgias or shortness of breath. Current antihyperlipidemic treatment includes statins. Risk factors for coronary artery disease include diabetes mellitus, dyslipidemia, hypertension, male sex, a sedentary lifestyle and obesity.  Hypertension This is a chronic problem. The current episode started more than 1 year ago. The problem is controlled. Pertinent negatives include no chest pain, headaches, neck pain, palpitations or shortness of breath. Risk factors for coronary artery disease include dyslipidemia, diabetes mellitus, obesity, sedentary lifestyle and male gender. Past treatments include ACE inhibitors.     Review of Systems  Constitutional: Negative for fatigue and unexpected weight change.  HENT: Negative for dental problem, mouth sores and trouble swallowing.   Eyes: Negative for visual disturbance.  Respiratory:  Negative for cough, choking, chest tightness, shortness of breath and wheezing.   Cardiovascular: Negative for chest pain, palpitations and leg swelling.  Gastrointestinal: Negative for abdominal distention, abdominal pain, constipation, diarrhea, nausea and vomiting.  Endocrine: Negative for polydipsia, polyphagia and polyuria.  Genitourinary: Negative for dysuria, flank pain, hematuria and urgency.  Musculoskeletal: Negative for back pain, gait problem, myalgias and neck pain.  Skin: Negative for pallor, rash and wound.  Neurological: Negative for seizures, syncope, weakness, numbness and headaches.  Psychiatric/Behavioral: Negative.  Negative for confusion and dysphoric mood.    Objective:    BP 138/84   Pulse 76   Ht  (1.88 m)   Wt 283 lb 3.2 oz (128.5 kg)   BMI 36.36 kg/m   Wt Readings from Last 3 Encounters:  08/27/19 283 lb 3.2 oz (128.5 kg)  03/19/19 282 lb 12.8 oz (128.3 kg)  11/15/18 276 lb 9.6 oz (125.5 kg)    Physical Exam Constitutional:      General: He is not in acute distress.    Appearance: He is well-developed.  HENT:     Head: Normocephalic and atraumatic.  Neck:     Thyroid: No thyromegaly.     Trachea: No tracheal  deviation.  Cardiovascular:     Rate and Rhythm: Normal rate.     Pulses:          Dorsalis pedis pulses are 1+ on the right side and 1+ on the left side.       Posterior tibial pulses are 1+ on the right side and 1+ on the left side.     Heart sounds: S1 normal and S2 normal. No murmur heard.  No gallop.   Pulmonary:     Effort: Pulmonary effort is normal. No respiratory distress.     Breath sounds: No wheezing.  Abdominal:     General: There is no distension.     Tenderness: There is no abdominal tenderness. There is no guarding.  Musculoskeletal:     Right shoulder: No swelling or deformity.     Cervical back: Normal range of motion and neck supple.  Skin:    General: Skin is warm and dry.     Findings: No rash.     Nails:  There is no clubbing.  Neurological:     Mental Status: He is alert and oriented to person, place, and time.     Cranial Nerves: No cranial nerve deficit.     Sensory: No sensory deficit.     Gait: Gait normal.     Deep Tendon Reflexes: Reflexes are normal and symmetric.  Psychiatric:        Speech: Speech normal.        Behavior: Behavior is cooperative.        Judgment: Judgment normal.     Results for orders placed or performed in visit on 08/27/19  HgB A1c  Result Value Ref Range   Hemoglobin A1C 8.0 (A) 4.0 - 5.6 %   HbA1c POC (<> result, manual entry)     HbA1c, POC (prediabetic range)     HbA1c, POC (controlled diabetic range)     Complete Blood Count (Most recent): Lab Results  Component Value Date   WBC 8.8 08/29/2018   HGB 16.6 08/29/2018   HCT 48.0 08/29/2018   MCV 88.2 08/29/2018   PLT PLATELET CLUMPS NOTED ON SMEAR, UNABLE TO ESTIMATE 08/29/2018   Chemistry (most recent): Lab Results  Component Value Date   NA 142 07/18/2019   K 4.1 07/18/2019   CL 107 07/18/2019   CO2 27 07/18/2019   BUN 21 07/18/2019   CREATININE 0.82 07/18/2019   Diabetic Labs (most recent): Lab Results  Component Value Date   HGBA1C 8.0 (A) 08/27/2019   HGBA1C 8.5 (A) 03/19/2019   HGBA1C 8.7 (A) 11/15/2018   Lipid Panel     Component Value Date/Time   CHOL 164 07/18/2019 0954   TRIG 153 (H) 07/18/2019 0954   HDL 30 (L) 07/18/2019 0954   CHOLHDL 5.5 (H) 07/18/2019 0954   VLDL 10 02/12/2016 0834   LDLCALC 107 (H) 07/18/2019 0954     Assessment & Plan:   1. Uncontrolled type 2 diabetes mellitus with complication, without long-term current use of insulin (HCC)  He did not bring any logs nor meter to review.  He reports that his blood glucose profile ranges between 140-189.  His point-of-care A1c is 8%, slowly improving from 8.7%.  He denies hypoglycemia.    -  he remains at a high risk for more acute and chronic complications of diabetes which include CAD, CVA, CKD,  retinopathy, and neuropathy. These are all discussed in detail with the patient.   Recent labs reviewed.   - I  have re-counseled the patient on diet management and weight loss  by adopting a carbohydrate restricted / protein rich  Diet.  - he  admits there is a room for improvement in his diet and drink choices. -  Suggestion is made for him to avoid simple carbohydrates  from his diet including Cakes, Sweet Desserts / Pastries, Ice Cream, Soda (diet and regular), Sweet Tea, Candies, Chips, Cookies, Sweet Pastries,  Store Bought Juices, Alcohol in Excess of  1-2 drinks a day, Artificial Sweeteners, Coffee Creamer, and "Sugar-free" Products. This will help patient to have stable blood glucose profile and potentially avoid unintended weight gain.   - Patient is advised to stick to a routine mealtimes to eat 3 meals  a day and avoid unnecessary snacks ( to snack only to correct hypoglycemia).  - He will need a higher dose of basal insulin in order for his to achieve and maintain control of diabetes to target.   -He is advised to increase his [place] to 60 units nightly, continue to monitor blood glucose at least twice a day-daily before breakfast and at bedtime.   -He is encouraged to call clinic if he registers less than 70 or greater than 150 x 3. -He is advised to continue Janumet 50/1000 mg p.o. twice daily- after breakfast and after supper.     - Patient specific target  for A1c; LDL, HDL, Triglycerides,  were discussed in detail.  2) BP/HTN: His blood pressure is controlled to target.  He is advised to continue his current blood pressure medication involving lisinopril 20 mg p.o. daily with breakfast.     3) Lipids/HPL: His recent lipid panel showed worsening LDL at 107.  He admits that he has not been consistent with simvastatin.  He is advised to resume a consistent o his  Simvastatin 40 mg p.o. nightly.   4)  Weight/Diet: His BMI is 36.3 -clearly  A candidate for modest weight loss.   CDE consult in progress, exercise, and carbohydrates information provided.  5) Chronic Care/Health Maintenance:  -Patient is on ACEI/ARB and Statin medications and encouraged to continue to follow up with Ophthalmology, Podiatrist at least yearly or according to recommendations, and advised to  stay away from smoking. I have recommended yearly flu vaccine and pneumonia vaccination at least every 5 years; moderate intensity exercise for up to 150 minutes weekly; and  sleep for at least 7 hours a day.  I advised patient to maintain close follow up with his PCP for primary care needs.  - Time spent on this patient care encounter:  35 min, of which > 50% was spent in  counseling and the rest reviewing his blood glucose logs , discussing his hypoglycemia and hyperglycemia episodes, reviewing his current and  previous labs / studies  ( including abstraction from other facilities) and medications  doses and developing a  long term treatment plan and documenting his care.   Please refer to Patient Instructions for Blood Glucose Monitoring and Insulin/Medications Dosing Guide"  in media tab for additional information. Please  also refer to " Patient Self Inventory" in the Media  tab for reviewed elements of pertinent patient history.  Preston Shoe participated in the discussions, expressed understanding, and voiced agreement with the above plans.  All questions were answered to his satisfaction. he is encouraged to contact clinic should he have any questions or concerns prior to his return visit.    Follow up plan: Return in about 4 months (around 12/28/2019) for Bring  Meter and Logs- A1c in Office.  Marquis Lunch, MD Phone: [phone]  Fax: [phone]  This note was partially dictated with voice recognition software. Similar sounding words can be transcribed inadequately or may not  be corrected upon review.  08/27/2019, 4:44 PM

## 2019-09-10 ENCOUNTER — Other Ambulatory Visit: Payer: Self-pay

## 2019-09-10 ENCOUNTER — Other Ambulatory Visit: Payer: Self-pay | Admitting: "Endocrinology

## 2019-09-10 DIAGNOSIS — IMO0002 Reserved for concepts with insufficient information to code with codable children: Secondary | ICD-10-CM

## 2019-10-03 ENCOUNTER — Other Ambulatory Visit: Payer: Self-pay | Admitting: "Endocrinology

## 2019-11-04 ENCOUNTER — Other Ambulatory Visit: Payer: Self-pay | Admitting: "Endocrinology

## 2019-11-04 DIAGNOSIS — E782 Mixed hyperlipidemia: Secondary | ICD-10-CM

## 2019-11-04 DIAGNOSIS — I10 Essential (primary) hypertension: Secondary | ICD-10-CM

## 2019-12-11 ENCOUNTER — Other Ambulatory Visit: Payer: Self-pay | Admitting: "Endocrinology

## 2019-12-11 DIAGNOSIS — E1165 Type 2 diabetes mellitus with hyperglycemia: Secondary | ICD-10-CM

## 2019-12-11 DIAGNOSIS — IMO0002 Reserved for concepts with insufficient information to code with codable children: Secondary | ICD-10-CM

## 2019-12-30 ENCOUNTER — Encounter: Payer: Self-pay | Admitting: "Endocrinology

## 2019-12-30 ENCOUNTER — Ambulatory Visit: Payer: BC Managed Care – PPO | Admitting: "Endocrinology

## 2019-12-30 ENCOUNTER — Other Ambulatory Visit: Payer: Self-pay

## 2019-12-30 VITALS — BP 140/74 | HR 68 | Ht 74.0 in | Wt 290.4 lb

## 2019-12-30 DIAGNOSIS — Z6836 Body mass index (BMI) 36.0-36.9, adult: Secondary | ICD-10-CM

## 2019-12-30 DIAGNOSIS — E1165 Type 2 diabetes mellitus with hyperglycemia: Secondary | ICD-10-CM

## 2019-12-30 DIAGNOSIS — IMO0002 Reserved for concepts with insufficient information to code with codable children: Secondary | ICD-10-CM

## 2019-12-30 DIAGNOSIS — E118 Type 2 diabetes mellitus with unspecified complications: Secondary | ICD-10-CM | POA: Diagnosis not present

## 2019-12-30 DIAGNOSIS — I10 Essential (primary) hypertension: Secondary | ICD-10-CM

## 2019-12-30 DIAGNOSIS — E782 Mixed hyperlipidemia: Secondary | ICD-10-CM

## 2019-12-30 LAB — POCT GLYCOSYLATED HEMOGLOBIN (HGB A1C): HbA1c, POC (controlled diabetic range): 8.2 % — AB (ref 0.0–7.0)

## 2019-12-30 MED ORDER — TRESIBA FLEXTOUCH 100 UNIT/ML ~~LOC~~ SOPN: 70.0000 [IU] | PEN_INJECTOR | Freq: Every day | SUBCUTANEOUS | 2 refills | Status: DC

## 2019-12-30 NOTE — Progress Notes (Signed)
12/30/2019  Endocrinology follow-up note   Subjective:    Patient ID: Preston Mack, male    DOB: 08/22/1965,    Past Medical History:  Diagnosis Date  . Diabetes mellitus, type II (HCC)   . Hyperlipidemia   . Sleep apnea    no cpap in ten years  . Traumatic rotator cuff tear, right, initial encounter 07/24/2018   Past Surgical History:  Procedure Laterality Date  . BICEPT TENODESIS Right 10/02/2018   Procedure: BICEPS TENODESIS;  Surgeon: Salvatore Marvel, MD;  Location: [facility];  Service: Orthopedics;  Laterality: Right;  . Lipoma removal    . Osteophyte of bone    . SHOULDER ARTHROSCOPY WITH SUBACROMIAL DECOMPRESSION, ROTATOR CUFF REPAIR AND BICEP TENDON REPAIR Right 10/02/2018   Procedure: RIGHT SHOULDER ARTHROSCOPY DEBRIDEMENT,  WITH SUBACROMIAL DECOMPRESSION,  DISTAL CLAVICAL EXCISION, ROTATOR CUFF REPAIR AND  BICEP TENODESIS;  Surgeon: Salvatore Marvel, MD;  Location: [facility];  Service: Orthopedics;  Laterality: Right;  PRE/POST OP SCALENE   Social History   Socioeconomic History  . Marital status: Married    Spouse name: Not on file  . Number of children: Not on file  . Years of education: Not on file  . Highest education level: Not on file  Occupational History  . Not on file  Tobacco Use  . Smoking status: Former Games developer  . Smokeless tobacco: Never Used  Vaping Use  . Vaping Use: Never used  Substance and Sexual Activity  . Alcohol use: Yes    Alcohol/week: 0.0 standard drinks    Comment: occas  . Drug use: No  . Sexual activity: Not on file  Other Topics Concern  . Not on file  Social History Narrative  . Not on file   Social Determinants of Health   Financial Resource Strain:   . Difficulty of Paying Living Expenses: Not on file  Food Insecurity:   . Worried About Programme researcher, broadcasting/film/video in the Last Year: Not on file  . Ran Out of Food in the Last Year: Not on file  Transportation Needs:   . Lack of Transportation  (Medical): Not on file  . Lack of Transportation (Non-Medical): Not on file  Physical Activity:   . Days of Exercise per Week: Not on file  . Minutes of Exercise per Session: Not on file  Stress:   . Feeling of Stress : Not on file  Social Connections:   . Frequency of Communication with Friends and Family: Not on file  . Frequency of Social Gatherings with Friends and Family: Not on file  . Attends Religious Services: Not on file  . Active Member of Clubs or Organizations: Not on file  . Attends Banker Meetings: Not on file  . Marital Status: Not on file   Outpatient Encounter Medications as of 12/30/2019  Medication Sig  . B-D ULTRAFINE III SHORT PEN 31G X 8 MM MISC USE AS DIRECTED  . glucose blood (TRUE METRIX BLOOD GLUCOSE TEST) test strip Test BG 2 x daily. e11.65  . insulin degludec (TRESIBA FLEXTOUCH) 100 UNIT/ML FlexTouch Pen Inject 70 Units into the skin at bedtime.  Marland Kitchen JANUMET 50-1000 MG tablet TAKE (1) TABLET TWICE A DAY WITH FOOD---BREAKFAST AND SUPPER.  Marland Kitchen lisinopril (ZESTRIL) 20 MG tablet TAKE 1 TABLET BY MOUTH ONCE DAILY.  . simvastatin (ZOCOR) 40 MG tablet TAKE 1 TABLET BY MOUTH ONCE DAILY IN THE EVENING  . [DISCONTINUED] insulin degludec (TRESIBA FLEXTOUCH) 100 UNIT/ML FlexTouch Pen  Inject 0.6 mLs (60 Units total) into the skin at bedtime.   No facility-administered encounter medications on file as of 12/30/2019.   ALLERGIES: No Known Allergies VACCINATION STATUS:  There is no immunization history on file for this patient.  Diabetes He presents for his follow-up diabetic visit. He has type 2 diabetes mellitus. Onset time: He was diagnosed at approximate age of 37 years. His disease course has been worsening. There are no hypoglycemic associated symptoms. Pertinent negatives for hypoglycemia include no confusion, headaches, pallor or seizures. Pertinent negatives for diabetes include no chest pain, no fatigue, no polydipsia, no polyphagia, no polyuria and  no weakness. There are no hypoglycemic complications. Symptoms are worsening. There are no diabetic complications. Risk factors for coronary artery disease include diabetes mellitus, dyslipidemia, hypertension, family history, obesity, sedentary lifestyle, tobacco exposure and male sex. Current diabetic treatment includes oral agent (dual therapy) (He is currently on Tresiba 40 units, Janumet 50/1000 mg p.o. twice daily.). He is compliant with treatment most of the time. His weight is increasing steadily. He is following a generally unhealthy diet. When asked about meal planning, he reported none. He has had a previous visit with a dietitian. He participates in exercise intermittently. His home blood glucose trend is decreasing steadily. His breakfast blood glucose range is generally 140-180 mg/dl. His overall blood glucose range is 140-180 mg/dl. (He presents with his meter showing average blood glucose of 157-165 at fasting.  His point-of-care A1c is 8.2%, unchanged from his last visit A1c of 8%.  He denies hypoglycemia.  ) An ACE inhibitor/angiotensin II receptor blocker is being taken. Eye exam is current.  Hyperlipidemia This is a chronic problem. The current episode started more than 1 year ago. The problem is controlled. Exacerbating diseases include diabetes and obesity. Pertinent negatives include no chest pain, myalgias or shortness of breath. Current antihyperlipidemic treatment includes statins. Risk factors for coronary artery disease include diabetes mellitus, dyslipidemia, hypertension, male sex, a sedentary lifestyle and obesity.  Hypertension This is a chronic problem. The current episode started more than 1 year ago. The problem is controlled. Pertinent negatives include no chest pain, headaches, neck pain, palpitations or shortness of breath. Risk factors for coronary artery disease include dyslipidemia, diabetes mellitus, obesity, sedentary lifestyle and male gender. Past treatments include  ACE inhibitors.     Review of Systems  Constitutional: Negative for fatigue and unexpected weight change.  HENT: Negative for dental problem, mouth sores and trouble swallowing.   Eyes: Negative for visual disturbance.  Respiratory: Negative for cough, choking, chest tightness, shortness of breath and wheezing.   Cardiovascular: Negative for chest pain, palpitations and leg swelling.  Gastrointestinal: Negative for abdominal distention, abdominal pain, constipation, diarrhea, nausea and vomiting.  Endocrine: Negative for polydipsia, polyphagia and polyuria.  Genitourinary: Negative for dysuria, flank pain, hematuria and urgency.  Musculoskeletal: Negative for back pain, gait problem, myalgias and neck pain.  Skin: Negative for pallor, rash and wound.  Neurological: Negative for seizures, syncope, weakness, numbness and headaches.  Psychiatric/Behavioral: Negative.  Negative for confusion and dysphoric mood.    Objective:    BP 140/74   Pulse 68   Ht 6\' 2"  (1.88 m)   Wt 290 lb 6.4 oz (131.7 kg)   BMI 37.29 kg/m   Wt Readings from Last 3 Encounters:  12/30/19 290 lb 6.4 oz (131.7 kg)  08/27/19 283 lb 3.2 oz (128.5 kg)  03/19/19 282 lb 12.8 oz (128.3 kg)    Physical Exam Constitutional:  General: He is not in acute distress.    Appearance: He is well-developed.  HENT:     Head: Normocephalic and atraumatic.  Neck:     Thyroid: No thyromegaly.     Trachea: No tracheal deviation.  Cardiovascular:     Rate and Rhythm: Normal rate.     Pulses:          Dorsalis pedis pulses are 1+ on the right side and 1+ on the left side.       Posterior tibial pulses are 1+ on the right side and 1+ on the left side.     Heart sounds: S1 normal and S2 normal. No murmur heard.  No gallop.   Pulmonary:     Effort: Pulmonary effort is normal. No respiratory distress.     Breath sounds: No wheezing.  Abdominal:     General: There is no distension.     Tenderness: There is no abdominal  tenderness. There is no guarding.  Musculoskeletal:     Right shoulder: No swelling or deformity.     Cervical back: Normal range of motion and neck supple.  Skin:    General: Skin is warm and dry.     Findings: No rash.     Nails: There is no clubbing.  Neurological:     Mental Status: He is alert and oriented to person, place, and time.     Cranial Nerves: No cranial nerve deficit.     Sensory: No sensory deficit.     Gait: Gait normal.     Deep Tendon Reflexes: Reflexes are normal and symmetric.  Psychiatric:        Speech: Speech normal.        Behavior: Behavior is cooperative.        Judgment: Judgment normal.     Results for orders placed or performed in visit on 12/30/19  HgB A1c  Result Value Ref Range   Hemoglobin A1C     HbA1c POC (<> result, manual entry)     HbA1c, POC (prediabetic range)     HbA1c, POC (controlled diabetic range) 8.2 (A) 0.0 - 7.0 %   Complete Blood Count (Most recent): Lab Results  Component Value Date   WBC 8.8 08/29/2018   HGB 16.6 08/29/2018   HCT 48.0 08/29/2018   MCV 88.2 08/29/2018   PLT PLATELET CLUMPS NOTED ON SMEAR, UNABLE TO ESTIMATE 08/29/2018   Chemistry (most recent): Lab Results  Component Value Date   NA 142 07/18/2019   K 4.1 07/18/2019   CL 107 07/18/2019   CO2 27 07/18/2019   BUN 21 07/18/2019   CREATININE 0.82 07/18/2019   Diabetic Labs (most recent): Lab Results  Component Value Date   HGBA1C 8.2 (A) 12/30/2019   HGBA1C 8.0 (A) 08/27/2019   HGBA1C 8.5 (A) 03/19/2019   Lipid Panel     Component Value Date/Time   CHOL 164 07/18/2019 0954   TRIG 153 (H) 07/18/2019 0954   HDL 30 (L) 07/18/2019 0954   CHOLHDL 5.5 (H) 07/18/2019 0954   VLDL 10 02/12/2016 0834   LDLCALC 107 (H) 07/18/2019 0954     Assessment & Plan:   1. Uncontrolled type 2 diabetes mellitus with complication, without long-term current use of insulin (HCC)  He presents with his meter showing average blood glucose of 157-165 at fasting.   His point-of-care A1c is 8.2%, unchanged from his last visit A1c of 8%.  He denies hypoglycemia.   -  he remains at a high  risk for more acute and chronic complications of diabetes which include CAD, CVA, CKD, retinopathy, and neuropathy. These are all discussed in detail with the patient.   Recent labs reviewed.   - I have re-counseled the patient on diet management and weight loss  by adopting a carbohydrate restricted / protein rich  Diet.  - he  admits there is a room for improvement in his diet and drink choices. -  Suggestion is made for him to avoid simple carbohydrates  from his diet including Cakes, Sweet Desserts / Pastries, Ice Cream, Soda (diet and regular), Sweet Tea, Candies, Chips, Cookies, Sweet Pastries,  Store Bought Juices, Alcohol in Excess of  1-2 drinks a day, Artificial Sweeteners, Coffee Creamer, and "Sugar-free" Products. This will help patient to have stable blood glucose profile and potentially avoid unintended weight gain.   - Patient is advised to stick to a routine mealtimes to eat 3 meals  a day and avoid unnecessary snacks ( to snack only to correct hypoglycemia).  - He will need a higher dose of basal insulin in order for him to achieve and maintain control of diabetes to target.     -He is advised to increase his [place] to 70 units nightly,  continue to monitor blood glucose at least twice a day-daily before breakfast and at bedtime.   -He is encouraged to call clinic if he registers less than 70 or greater than 150 x 3. -He is advised to continue Janumet 50/1000 mg p.o. twice daily- after breakfast and after supper.     - Patient specific target  for A1c; LDL, HDL, Triglycerides,  were discussed in detail.  2) BP/HTN: His blood pressure is controlled to target.  He is advised to continue his current blood pressure medication involving lisinopril 20 mg p.o. daily with breakfast.     3) Lipids/HPL: His recent lipid panel showed worsening LDL at 107.  He  admits that he has not been consistent with simvastatin.  He is advised to resume a consistent o his simvastatin 40 mg p.o. nightly.  Side effects and precautions discussed with him.   4)  Weight/Diet: His BMI is 37.2 -clearly  A candidate for modest weight loss.  CDE consult in progress, exercise, and carbohydrates information provided.  5) Chronic Care/Health Maintenance:  -Patient is on ACEI/ARB and Statin medications and encouraged to continue to follow up with Ophthalmology, Podiatrist at least yearly or according to recommendations, and advised to  stay away from smoking. I have recommended yearly flu vaccine and pneumonia vaccination at least every 5 years; moderate intensity exercise for up to 150 minutes weekly; and  sleep for at least 7 hours a day.  I advised patient to maintain close follow up with his PCP for primary care needs.  - Time spent on this patient care encounter:  35 min, of which > 50% was spent in  counseling and the rest reviewing his blood glucose logs , discussing his hypoglycemia and hyperglycemia episodes, reviewing his current and  previous labs / studies  ( including abstraction from other facilities) and medications  doses and developing a  long term treatment plan and documenting his care.   Please refer to Patient Instructions for Blood Glucose Monitoring and Insulin/Medications Dosing Guide"  in media tab for additional information. Please  also refer to " Patient Self Inventory" in the Media  tab for reviewed elements of pertinent patient history.  Helene Shoe participated in the discussions, expressed understanding, and voiced agreement  with the above plans.  All questions were answered to his satisfaction. he is encouraged to contact clinic should he have any questions or concerns prior to his return visit.   Follow up plan: No follow-ups on file.  Marquis Lunch, MD Phone: [phone]  Fax: [phone]  This note was partially dictated with voice  recognition software. Similar sounding words can be transcribed inadequately or may not  be corrected upon review.  12/30/2019, 1:47 PM

## 2019-12-30 NOTE — Patient Instructions (Signed)

## 2020-02-04 ENCOUNTER — Other Ambulatory Visit: Payer: Self-pay | Admitting: "Endocrinology

## 2020-02-04 DIAGNOSIS — E782 Mixed hyperlipidemia: Secondary | ICD-10-CM

## 2020-02-12 ENCOUNTER — Other Ambulatory Visit: Payer: Self-pay | Admitting: "Endocrinology

## 2020-02-12 DIAGNOSIS — I10 Essential (primary) hypertension: Secondary | ICD-10-CM

## 2020-02-12 DIAGNOSIS — E782 Mixed hyperlipidemia: Secondary | ICD-10-CM

## 2020-03-19 ENCOUNTER — Other Ambulatory Visit: Payer: Self-pay | Admitting: "Endocrinology

## 2020-03-19 DIAGNOSIS — IMO0002 Reserved for concepts with insufficient information to code with codable children: Secondary | ICD-10-CM

## 2020-03-19 DIAGNOSIS — E1165 Type 2 diabetes mellitus with hyperglycemia: Secondary | ICD-10-CM

## 2020-04-06 ENCOUNTER — Other Ambulatory Visit: Payer: Self-pay | Admitting: "Endocrinology

## 2020-04-14 IMAGING — MR MR SHOULDER*L* W/O CM
4 of 5 series · 19 of 40 positions shown · non-contrast
Comparison: None.

CLINICAL DATA: Left shoulder and neck pain for 3 months.

EXAM:
MRI OF THE LEFT SHOULDER WITHOUT CONTRAST
TECHNIQUE: Multiplanar, multisequence MR imaging of the shoulder was performed.
No intravenous contrast was administered.

[Series 6: PD fat-sat · axial · left · 4.0mm · 0.44mm/px · z∈[-50,+40]mm · 6 of 20 slices shown (1 of 2)]
[im 1/20]
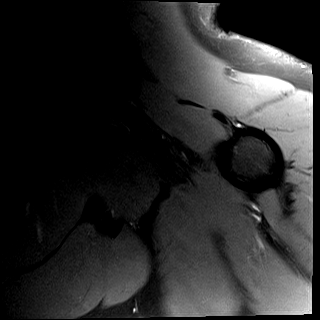
[im 4/20]
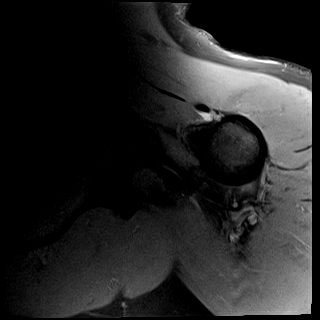
[im 8/20]
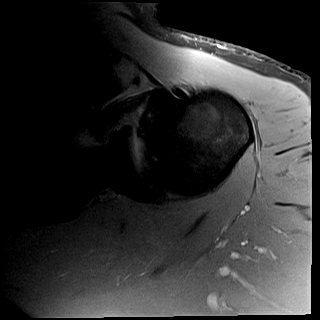
[im 12/20]
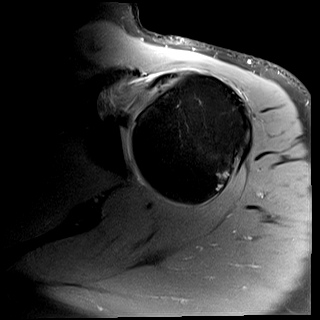
[im 16/20]
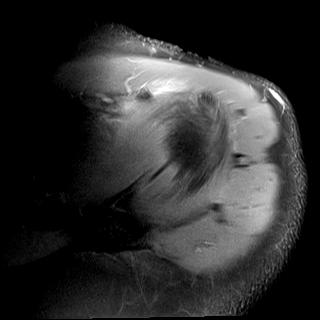
[im 20/20]
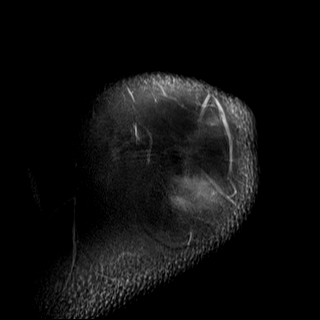

[Series 8: T2 fat-sat · oblique · left · 4.0mm · 0.44mm/px · 3 of 23 slices shown (1 of 2)]
[im 3/23]
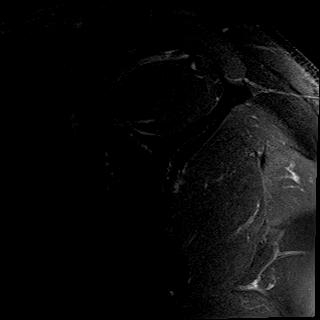
[im 12/23]
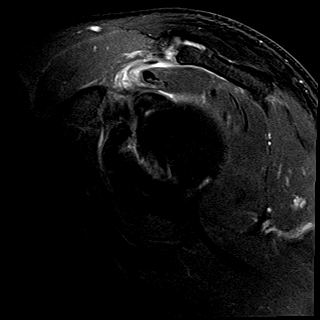
[im 20/23]
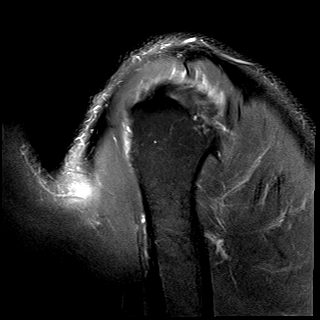

[Series 9: T2 fat-sat · oblique · left · 4.0mm · 0.23mm/px · 3 of 21 slices shown (2 of 2)]
[im 3/21]
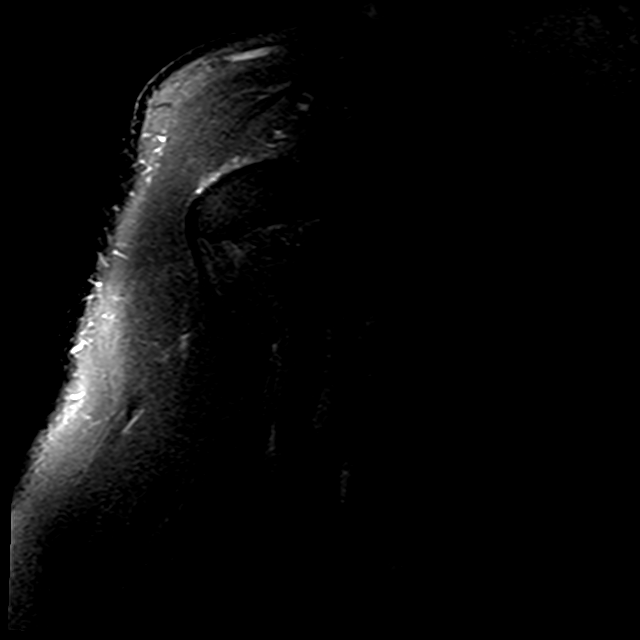
[im 12/21]
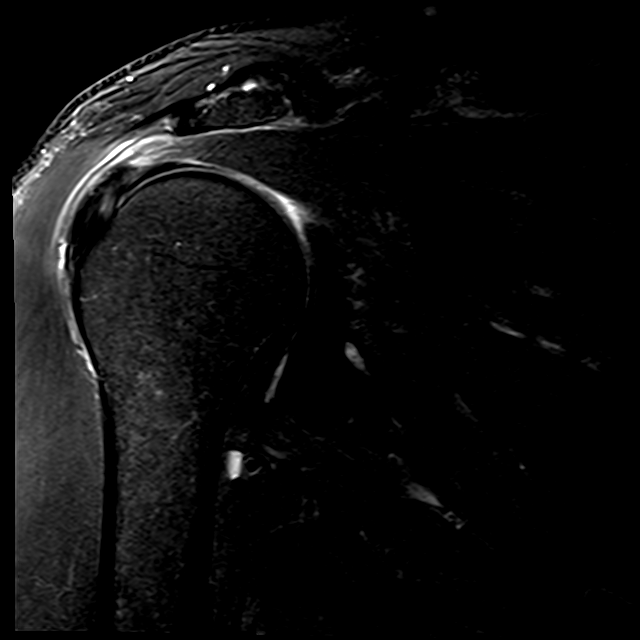
[im 18/21]
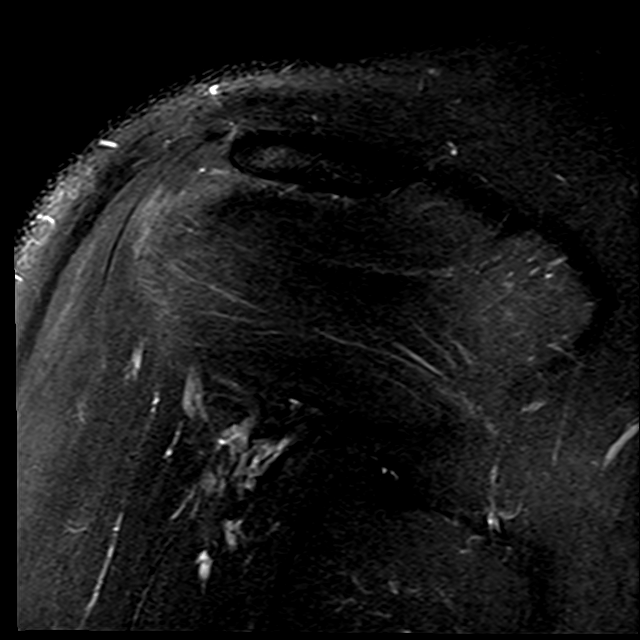

[Series 10: PD fat-sat · oblique · left · 4.0mm · 0.23mm/px · 7 of 21 slices shown (2 of 2)]
[im 1/21]
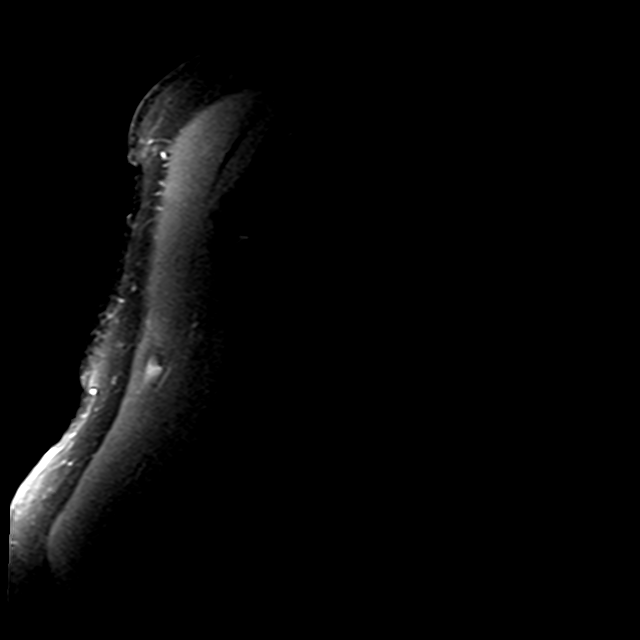
[im 3/21]
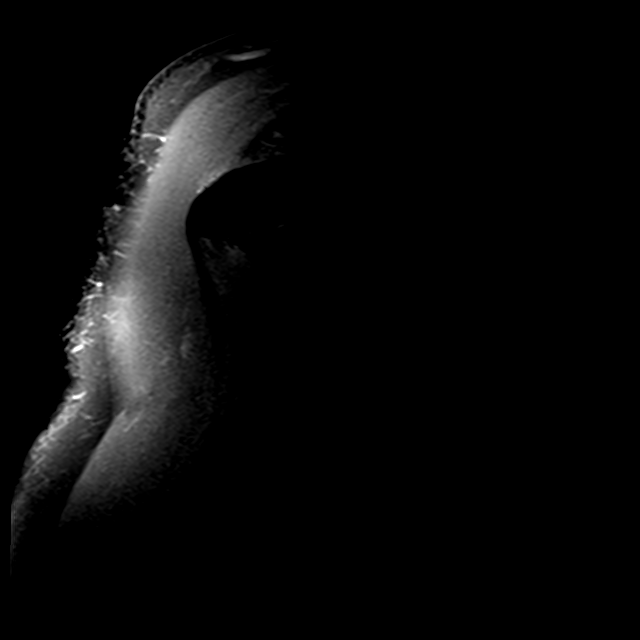
[im 6/21]
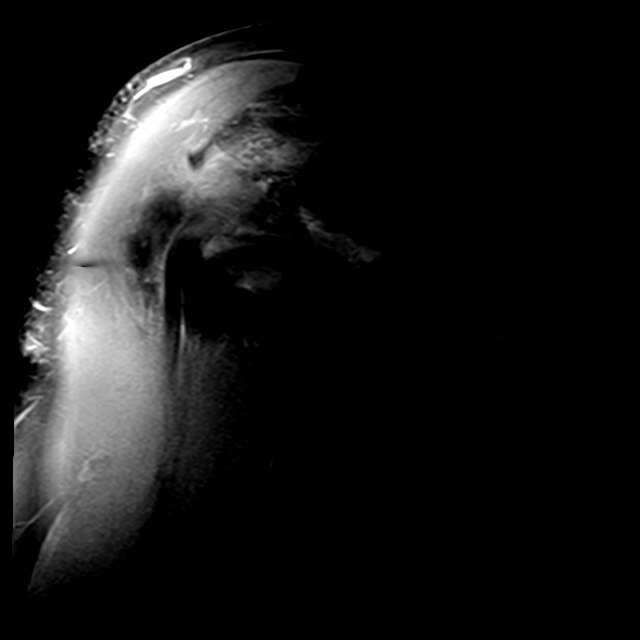
[im 9/21]
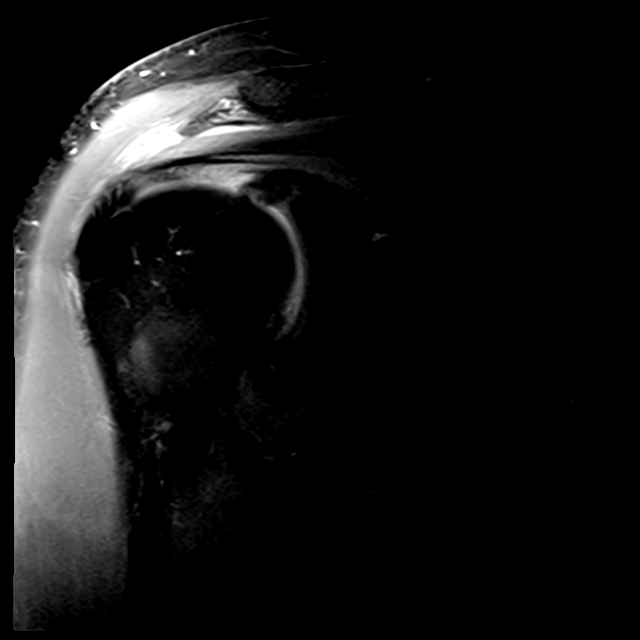
[im 12/21]
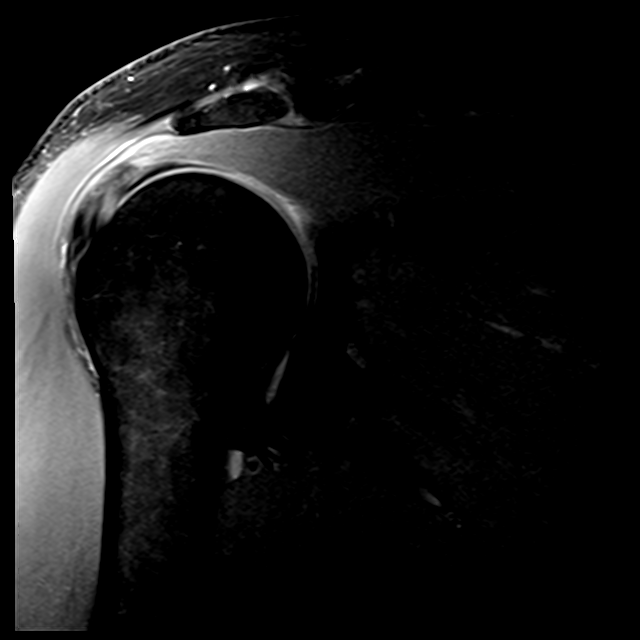
[im 15/21]
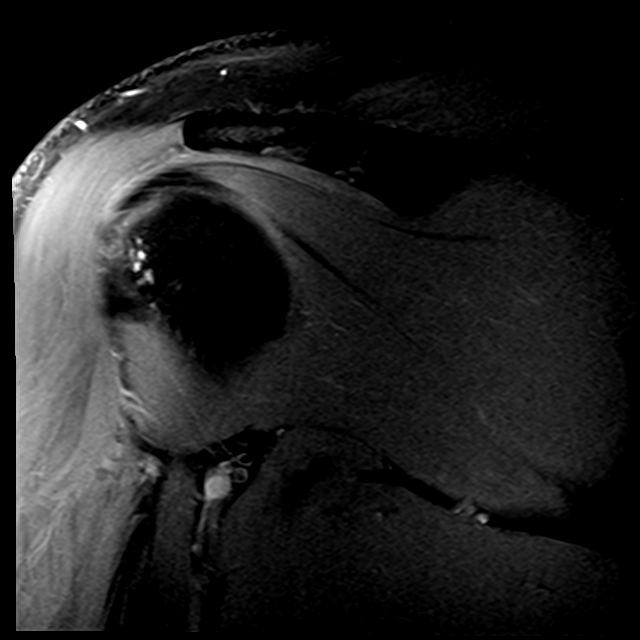
[im 18/21]
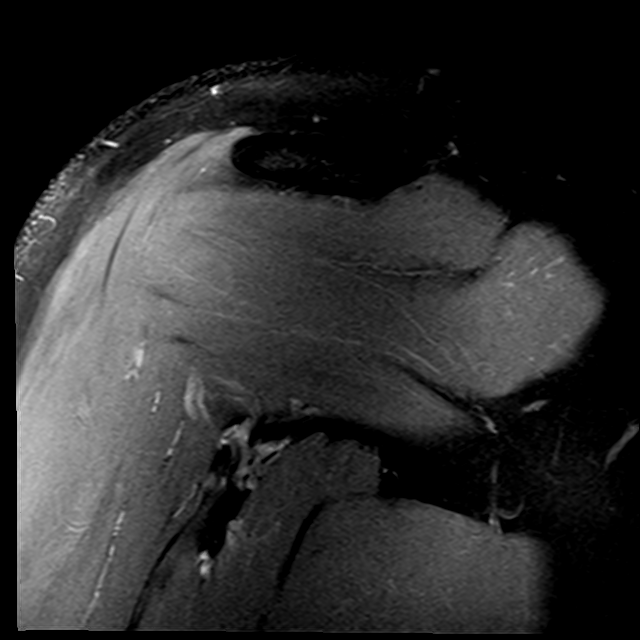

[19 of 40 positions shown; findings below may reference images not displayed]

FINDINGS: Rotator cuff: Moderate supraspinatus tendinopathy/tendinosis with
interstitial tears. There is also mild bursal and articular surface
fraying and fibrillation. No full thickness retracted rotator cuff
tear.

Mild infraspinatus and subscapularis tendinopathy.

Muscles:  No significant findings.

Biceps long head: Intact. There is tendinopathy involving the
intra-articular portion of the tendon with thickening and increased
T2 signal intensity.

Acromioclavicular Joint: Mild degenerative changes. Type 1-2
acromion. No lateral downsloping or undersurface spurring.

Glenohumeral Joint: Mild degenerative chondrosis. There is a small
joint effusion and moderate synovitis versus adhesive capsulitis.

Labrum:  No definite labral tears.

Bones: No acute bony findings. Subchondral cystic change noted near
the infraspinatus attachment on the greater tuberosity.

Other: Moderate subacromial/subdeltoid bursitis.
IMPRESSION: 1. Moderate supraspinatus tendinopathy/tendinosis with interstitial
tears and mild bursal and articular surface fraying and
fibrillation. No full thickness retracted rotator cuff tear.
2. Long head biceps tendinopathy.
3. No significant findings for bony impingement.
4. Small joint effusion and moderate synovitis versus adhesive
capsulitis.
5. Moderate subacromial/subdeltoid bursitis.

## 2020-04-28 ENCOUNTER — Other Ambulatory Visit: Payer: Self-pay

## 2020-04-28 ENCOUNTER — Ambulatory Visit: Payer: BC Managed Care – PPO | Admitting: "Endocrinology

## 2020-04-28 ENCOUNTER — Encounter: Payer: Self-pay | Admitting: "Endocrinology

## 2020-04-28 VITALS — BP 149/92 | HR 68 | Ht 74.0 in | Wt 291.8 lb

## 2020-04-28 DIAGNOSIS — I10 Essential (primary) hypertension: Secondary | ICD-10-CM

## 2020-04-28 DIAGNOSIS — E1165 Type 2 diabetes mellitus with hyperglycemia: Secondary | ICD-10-CM | POA: Diagnosis not present

## 2020-04-28 DIAGNOSIS — E782 Mixed hyperlipidemia: Secondary | ICD-10-CM

## 2020-04-28 DIAGNOSIS — E118 Type 2 diabetes mellitus with unspecified complications: Secondary | ICD-10-CM

## 2020-04-28 DIAGNOSIS — IMO0002 Reserved for concepts with insufficient information to code with codable children: Secondary | ICD-10-CM

## 2020-04-28 LAB — POCT GLYCOSYLATED HEMOGLOBIN (HGB A1C): HbA1c, POC (controlled diabetic range): 8.1 % — AB (ref 0.0–7.0)

## 2020-04-28 MED ORDER — TRUE METRIX BLOOD GLUCOSE TEST VI STRP: ORAL_STRIP | 1 refills | Status: DC

## 2020-04-28 MED ORDER — LISINOPRIL 30 MG PO TABS: 30.0000 mg | ORAL_TABLET | Freq: Every day | ORAL | 1 refills | Status: DC

## 2020-04-28 MED ORDER — TRESIBA FLEXTOUCH 100 UNIT/ML ~~LOC~~ SOPN: 80.0000 [IU] | PEN_INJECTOR | Freq: Every day | SUBCUTANEOUS | 2 refills | Status: DC

## 2020-04-28 NOTE — Progress Notes (Signed)
04/28/2020  Endocrinology follow-up note   Subjective:    Patient ID: Preston Mack, male    DOB: 22-Apr-1965,    Past Medical History:  Diagnosis Date  . Diabetes mellitus, type II (HCC)   . Hyperlipidemia   . Sleep apnea    no cpap in ten years  . Traumatic rotator cuff tear, right, initial encounter 07/24/2018   Past Surgical History:  Procedure Laterality Date  . BICEPT TENODESIS Right 10/02/2018   Procedure: BICEPS TENODESIS;  Surgeon: Salvatore Marvel, MD;  Location: [facility];  Service: Orthopedics;  Laterality: Right;  . Lipoma removal    . Osteophyte of bone    . SHOULDER ARTHROSCOPY WITH SUBACROMIAL DECOMPRESSION, ROTATOR CUFF REPAIR AND BICEP TENDON REPAIR Right 10/02/2018   Procedure: RIGHT SHOULDER ARTHROSCOPY DEBRIDEMENT,  WITH SUBACROMIAL DECOMPRESSION,  DISTAL CLAVICAL EXCISION, ROTATOR CUFF REPAIR AND  BICEP TENODESIS;  Surgeon: Salvatore Marvel, MD;  Location: [facility];  Service: Orthopedics;  Laterality: Right;  PRE/POST OP SCALENE   Social History   Socioeconomic History  . Marital status: Married    Spouse name: Not on file  . Number of children: Not on file  . Years of education: Not on file  . Highest education level: Not on file  Occupational History  . Not on file  Tobacco Use  . Smoking status: Former Games developer  . Smokeless tobacco: Never Used  Vaping Use  . Vaping Use: Never used  Substance and Sexual Activity  . Alcohol use: Yes    Alcohol/week: 0.0 standard drinks    Comment: occas  . Drug use: No  . Sexual activity: Not on file  Other Topics Concern  . Not on file  Social History Narrative  . Not on file   Social Determinants of Health   Financial Resource Strain: Not on file  Food Insecurity: Not on file  Transportation Needs: Not on file  Physical Activity: Not on file  Stress: Not on file  Social Connections: Not on file   Outpatient Encounter Medications as of 04/28/2020  Medication Sig  . B-D  ULTRAFINE III SHORT PEN 31G X 8 MM MISC USE AS DIRECTED  . glucose blood (TRUE METRIX BLOOD GLUCOSE TEST) test strip Test BG 2 x daily. e11.65  . insulin degludec (TRESIBA FLEXTOUCH) 100 UNIT/ML FlexTouch Pen Inject 80 Units into the skin at bedtime.  Marland Kitchen JANUMET 50-1000 MG tablet TAKE (1) TABLET TWICE A DAY WITH FOOD---BREAKFAST AND SUPPER.  Marland Kitchen lisinopril (ZESTRIL) 30 MG tablet Take 1 tablet (30 mg total) by mouth daily.  . simvastatin (ZOCOR) 40 MG tablet TAKE 1 TABLET BY MOUTH ONCE DAILY IN THE EVENING  . [DISCONTINUED] glucose blood (TRUE METRIX BLOOD GLUCOSE TEST) test strip Test BG 2 x daily. e11.65  . [DISCONTINUED] lisinopril (ZESTRIL) 20 MG tablet TAKE 1 TABLET BY MOUTH ONCE DAILY.  . [DISCONTINUED] TRESIBA FLEXTOUCH 100 UNIT/ML FlexTouch Pen INJECT 0.7MLS (70 UNITS TOTAL) INTO THE SKIN AT BEDTIME.   No facility-administered encounter medications on file as of 04/28/2020.   ALLERGIES: No Known Allergies VACCINATION STATUS:  There is no immunization history on file for this patient.  Diabetes He presents for his follow-up diabetic visit. He has type 2 diabetes mellitus. Onset time: He was diagnosed at approximate age of 37 years. His disease course has been stable. There are no hypoglycemic associated symptoms. Pertinent negatives for hypoglycemia include no confusion, headaches, pallor or seizures. Associated symptoms include polydipsia and polyuria. Pertinent negatives for diabetes include no  chest pain, no fatigue, no polyphagia and no weakness. There are no hypoglycemic complications. Symptoms are stable. There are no diabetic complications. Risk factors for coronary artery disease include diabetes mellitus, dyslipidemia, hypertension, family history, obesity, sedentary lifestyle, tobacco exposure and male sex. Current diabetic treatment includes oral agent (dual therapy) (He is currently on Tresiba 40 units, Janumet 50/1000 mg p.o. twice daily.). He is compliant with treatment most of the  time. His weight is increasing steadily. He is following a generally unhealthy diet. When asked about meal planning, he reported none. He has had a previous visit with a dietitian. He participates in exercise intermittently. His home blood glucose trend is fluctuating minimally. His breakfast blood glucose range is generally 130-140 mg/dl. His overall blood glucose range is 130-140 mg/dl. (He presents  with his meter showing average blood glucose of 150-165 at fasting.  His point-of-care A1c is 8.1%, unchanged from his last visit A1c of 8%.  He denies hypoglycemia.  ) An ACE inhibitor/angiotensin II receptor blocker is being taken. Eye exam is current.  Hyperlipidemia This is a chronic problem. The current episode started more than 1 year ago. The problem is controlled. Exacerbating diseases include diabetes and obesity. Pertinent negatives include no chest pain, myalgias or shortness of breath. Current antihyperlipidemic treatment includes statins. Risk factors for coronary artery disease include diabetes mellitus, dyslipidemia, hypertension, male sex, a sedentary lifestyle and obesity.  Hypertension This is a chronic problem. The current episode started more than 1 year ago. The problem is controlled. Pertinent negatives include no chest pain, headaches, neck pain, palpitations or shortness of breath. Risk factors for coronary artery disease include dyslipidemia, diabetes mellitus, obesity, sedentary lifestyle and male gender. Past treatments include ACE inhibitors.     Review of Systems  Constitutional: Negative for fatigue and unexpected weight change.  HENT: Negative for dental problem, mouth sores and trouble swallowing.   Eyes: Negative for visual disturbance.  Respiratory: Negative for cough, choking, chest tightness, shortness of breath and wheezing.   Cardiovascular: Negative for chest pain, palpitations and leg swelling.  Gastrointestinal: Negative for abdominal distention, abdominal pain,  constipation, diarrhea, nausea and vomiting.  Endocrine: Positive for polydipsia and polyuria. Negative for polyphagia.  Genitourinary: Negative for dysuria, flank pain, hematuria and urgency.  Musculoskeletal: Negative for back pain, gait problem, myalgias and neck pain.  Skin: Negative for pallor, rash and wound.  Neurological: Negative for seizures, syncope, weakness, numbness and headaches.  Psychiatric/Behavioral: Negative.  Negative for confusion and dysphoric mood.    Objective:    BP (!) 149/92   Pulse 68   Ht 6\' 2"  (1.88 m)   Wt 291 lb 12.8 oz (132.4 kg)   BMI 37.46 kg/m   Wt Readings from Last 3 Encounters:  04/28/20 291 lb 12.8 oz (132.4 kg)  12/30/19 290 lb 6.4 oz (131.7 kg)  08/27/19 283 lb 3.2 oz (128.5 kg)    Physical Exam Constitutional:      General: He is not in acute distress.    Appearance: He is well-developed.  HENT:     Head: Normocephalic and atraumatic.  Neck:     Thyroid: No thyromegaly.     Trachea: No tracheal deviation.  Cardiovascular:     Rate and Rhythm: Normal rate.     Pulses:          Dorsalis pedis pulses are 1+ on the right side and 1+ on the left side.       Posterior tibial pulses are 1+ on the right  side and 1+ on the left side.     Heart sounds: S1 normal and S2 normal. No murmur heard. No gallop.   Pulmonary:     Effort: Pulmonary effort is normal. No respiratory distress.     Breath sounds: No wheezing.  Abdominal:     General: There is no distension.     Tenderness: There is no abdominal tenderness. There is no guarding.  Musculoskeletal:     Right shoulder: No swelling or deformity.     Cervical back: Normal range of motion and neck supple.  Skin:    General: Skin is warm and dry.     Findings: No rash.     Nails: There is no clubbing.  Neurological:     Mental Status: He is alert and oriented to person, place, and time.     Cranial Nerves: No cranial nerve deficit.     Sensory: No sensory deficit.     Gait: Gait  normal.     Deep Tendon Reflexes: Reflexes are normal and symmetric.  Psychiatric:        Speech: Speech normal.        Behavior: Behavior is cooperative.        Judgment: Judgment normal.     Results for orders placed or performed in visit on 04/28/20  HgB A1c  Result Value Ref Range   Hemoglobin A1C     HbA1c POC (<> result, manual entry)     HbA1c, POC (prediabetic range)     HbA1c, POC (controlled diabetic range) 8.1 (A) 0.0 - 7.0 %   Complete Blood Count (Most recent): Lab Results  Component Value Date   WBC 8.8 08/29/2018   HGB 16.6 08/29/2018   HCT 48.0 08/29/2018   MCV 88.2 08/29/2018   PLT PLATELET CLUMPS NOTED ON SMEAR, UNABLE TO ESTIMATE 08/29/2018   Chemistry (most recent): Lab Results  Component Value Date   NA 142 07/18/2019   K 4.1 07/18/2019   CL 107 07/18/2019   CO2 27 07/18/2019   BUN 21 07/18/2019   CREATININE 0.82 07/18/2019   Diabetic Labs (most recent): Lab Results  Component Value Date   HGBA1C 8.1 (A) 04/28/2020   HGBA1C 8.2 (A) 12/30/2019   HGBA1C 8.0 (A) 08/27/2019   Lipid Panel     Component Value Date/Time   CHOL 164 07/18/2019 0954   TRIG 153 (H) 07/18/2019 0954   HDL 30 (L) 07/18/2019 0954   CHOLHDL 5.5 (H) 07/18/2019 0954   VLDL 10 02/12/2016 0834   LDLCALC 107 (H) 07/18/2019 0954     Assessment & Plan:   1. Uncontrolled type 2 diabetes mellitus with complication, without long-term current use of insulin (HCC)  He presents  with his meter showing average blood glucose of 150-165 at fasting.  His point-of-care A1c is 8.1%, unchanged from his last visit A1c of 8%.  He denies hypoglycemia.  -  he remains at a high risk for more acute and chronic complications of diabetes which include CAD, CVA, CKD, retinopathy, and neuropathy. These are all discussed in detail with the patient.   Recent labs reviewed.   - I have re-counseled the patient on diet management and weight loss  by adopting a carbohydrate restricted / protein rich   Diet.  - he acknowledges that there is a room for improvement in his food and drink choices. - Suggestion is made for him to avoid simple carbohydrates  from his diet including Cakes, Sweet Desserts, Ice Cream, Soda (diet and  regular), Sweet Tea, Candies, Chips, Cookies, Store Bought Juices, Alcohol in Excess of  1-2 drinks a day, Artificial Sweeteners,  Coffee Creamer, and "Sugar-free" Products, Lemonade. This will help patient to have more stable blood glucose profile and potentially avoid unintended weight gain.  - Patient is advised to stick to a routine mealtimes to eat 3 meals  a day and avoid unnecessary snacks ( to snack only to correct hypoglycemia).  - He will need higher dose of basal insulin in order for him to achieve control of diabetes to target.     -He is advised to increase his [place]iba to 80 units nightly,   continue to monitor blood glucose at least twice a day-daily before breakfast and at bedtime.   -He is encouraged to call clinic if he registers less than 70 or greater than 150 x 3. -He is advised to continue Janumet 50/1000 mg p.o. twice daily- after breakfast and after supper.     - Patient specific target  for A1c; LDL, HDL, Triglycerides,  were discussed in detail.  2) BP/HTN: His blood pressure is not controlled to target.  His lisinopril will be increased to 30 mg p.o. daily at breakfast.      3) Lipids/HPL: His recent lipid panel showed worsening LDL at 107.  He reports better consistency taking his simvastatin.  He will have fasting lipid panel along with his next labs.   4)  Weight/Diet: His BMI is 37.46 - -clearly  A candidate for modest weight loss.  CDE consult in progress, exercise, and carbohydrates information provided.  5) Chronic Care/Health Maintenance:  -Patient is on ACEI/ARB and Statin medications and encouraged to continue to follow up with Ophthalmology, Podiatrist at least yearly or according to recommendations, and advised to  stay away from  smoking. I have recommended yearly flu vaccine and pneumonia vaccination at least every 5 years; moderate intensity exercise for up to 150 minutes weekly; and  sleep for at least 7 hours a day.  POCT ABI Results 04/28/20  His screening ABI is negative for PAD today April 28, 2020. Right ABI: 1.28      left ABI: 1.29  Right leg systolic / diastolic: 191/103 mmHg Left leg systolic / diastolic: 192/103 mmHg  Arm systolic / diastolic: 149/92 mmHG This study will be repeated in March 2027, or sooner if needed.  I advised patient to maintain close follow up with his PCP for primary care needs.  - Time spent on this patient care encounter:  40 min, of which > 50% was spent in  counseling and the rest reviewing his blood glucose logs , discussing his hypoglycemia and hyperglycemia episodes, reviewing his current and  previous labs / studies  ( including abstraction from other facilities) and medications  doses and developing a  long term treatment plan and documenting his care.   Please refer to Patient Instructions for Blood Glucose Monitoring and Insulin/Medications Dosing Guide"  in media tab for additional information. Please  also refer to " Patient Self Inventory" in the Media  tab for reviewed elements of pertinent patient history.  Helene ShoeWilliam R Enerson participated in the discussions, expressed understanding, and voiced agreement with the above plans.  All questions were answered to his satisfaction. he is encouraged to contact clinic should he have any questions or concerns prior to his return visit.    Follow up plan: Return in about 3 months (around 07/29/2020) for F/U with Pre-visit Labs, Meter, Logs, A1c here.Marquis Lunch.  Gebre Nida, MD Phone:  [phone]  Fax: [phone]  This note was partially dictated with voice recognition software. Similar sounding words can be transcribed inadequately or may not  be corrected upon review.  04/28/2020, 12:49 PM

## 2020-04-28 NOTE — Patient Instructions (Signed)

## 2020-05-03 ENCOUNTER — Other Ambulatory Visit: Payer: Self-pay | Admitting: "Endocrinology

## 2020-05-03 DIAGNOSIS — E782 Mixed hyperlipidemia: Secondary | ICD-10-CM

## 2020-06-01 ENCOUNTER — Other Ambulatory Visit: Payer: Self-pay | Admitting: "Endocrinology

## 2020-06-21 ENCOUNTER — Other Ambulatory Visit: Payer: Self-pay | Admitting: "Endocrinology

## 2020-06-21 DIAGNOSIS — IMO0002 Reserved for concepts with insufficient information to code with codable children: Secondary | ICD-10-CM

## 2020-06-21 DIAGNOSIS — E1165 Type 2 diabetes mellitus with hyperglycemia: Secondary | ICD-10-CM

## 2020-06-30 ENCOUNTER — Other Ambulatory Visit: Payer: Self-pay | Admitting: "Endocrinology

## 2020-07-22 LAB — COMPREHENSIVE METABOLIC PANEL
ALT: 17 IU/L (ref 0–44)
AST: 7 IU/L (ref 0–40)
Albumin/Globulin Ratio: 2.4 — ABNORMAL HIGH (ref 1.2–2.2)
Albumin: 4.4 g/dL (ref 3.8–4.9)
Alkaline Phosphatase: 43 IU/L — ABNORMAL LOW (ref 44–121)
BUN/Creatinine Ratio: 31 — ABNORMAL HIGH (ref 9–20)
BUN: 25 mg/dL — ABNORMAL HIGH (ref 6–24)
Bilirubin Total: 0.5 mg/dL (ref 0.0–1.2)
CO2: 20 mmol/L (ref 20–29)
Calcium: 9.3 mg/dL (ref 8.7–10.2)
Chloride: 102 mmol/L (ref 96–106)
Creatinine, Ser: 0.8 mg/dL (ref 0.76–1.27)
Globulin, Total: 1.8 g/dL (ref 1.5–4.5)
Glucose: 182 mg/dL — ABNORMAL HIGH (ref 65–99)
Potassium: 4.7 mmol/L (ref 3.5–5.2)
Sodium: 138 mmol/L (ref 134–144)
Total Protein: 6.2 g/dL (ref 6.0–8.5)
eGFR: 106 mL/min/{1.73_m2} (ref >59)

## 2020-07-22 LAB — LIPID PANEL
Chol/HDL Ratio: 5.5 ratio — ABNORMAL HIGH (ref 0.0–5.0)
Cholesterol, Total: 188 mg/dL (ref 100–199)
HDL: 34 mg/dL — ABNORMAL LOW (ref >39)
LDL Chol Calc (NIH): 140 mg/dL — ABNORMAL HIGH (ref 0–99)
Triglycerides: 73 mg/dL (ref 0–149)
VLDL Cholesterol Cal: 14 mg/dL (ref 5–40)

## 2020-07-22 LAB — T4, FREE: Free T4: 1.46 ng/dL (ref 0.82–1.77)

## 2020-07-22 LAB — TSH: TSH: 1.48 u[IU]/mL (ref 0.450–4.500)

## 2020-07-30 ENCOUNTER — Ambulatory Visit: Payer: BC Managed Care – PPO | Admitting: "Endocrinology

## 2020-07-30 ENCOUNTER — Encounter: Payer: Self-pay | Admitting: "Endocrinology

## 2020-07-30 ENCOUNTER — Other Ambulatory Visit: Payer: Self-pay

## 2020-07-30 VITALS — BP 142/84 | HR 56 | Ht 74.0 in | Wt 286.0 lb

## 2020-07-30 DIAGNOSIS — E118 Type 2 diabetes mellitus with unspecified complications: Secondary | ICD-10-CM

## 2020-07-30 DIAGNOSIS — E1165 Type 2 diabetes mellitus with hyperglycemia: Secondary | ICD-10-CM

## 2020-07-30 DIAGNOSIS — IMO0002 Reserved for concepts with insufficient information to code with codable children: Secondary | ICD-10-CM

## 2020-07-30 DIAGNOSIS — I10 Essential (primary) hypertension: Secondary | ICD-10-CM | POA: Diagnosis not present

## 2020-07-30 DIAGNOSIS — E782 Mixed hyperlipidemia: Secondary | ICD-10-CM

## 2020-07-30 LAB — POCT GLYCOSYLATED HEMOGLOBIN (HGB A1C): HbA1c, POC (controlled diabetic range): 7.8 % — AB (ref 0.0–7.0)

## 2020-07-30 MED ORDER — SIMVASTATIN 40 MG PO TABS: 40.0000 mg | ORAL_TABLET | Freq: Every evening | ORAL | 1 refills | Status: DC

## 2020-07-30 NOTE — Patient Instructions (Signed)

## 2020-07-30 NOTE — Progress Notes (Signed)
07/30/2020  Endocrinology follow-up note   Subjective:    Patient ID: Preston Mack, male    DOB: [birth year],    Past Medical History:  Diagnosis Date   Diabetes mellitus, type II (HCC)    Hyperlipidemia    Sleep apnea    no cpap in ten years   Traumatic rotator cuff tear, right, initial encounter 07/24/2018   Past Surgical History:  Procedure Laterality Date   BICEPT TENODESIS Right 10/02/2018   Procedure: BICEPS TENODESIS;  Surgeon: Salvatore MarvelWainer, Robert, MD;  Location: [facility];  Service: Orthopedics;  Laterality: Right;   Lipoma removal     Osteophyte of bone     SHOULDER ARTHROSCOPY WITH SUBACROMIAL DECOMPRESSION, ROTATOR CUFF REPAIR AND BICEP TENDON REPAIR Right 10/02/2018   Procedure: RIGHT SHOULDER ARTHROSCOPY DEBRIDEMENT,  WITH SUBACROMIAL DECOMPRESSION,  DISTAL CLAVICAL EXCISION, ROTATOR CUFF REPAIR AND  BICEP TENODESIS;  Surgeon: Salvatore MarvelWainer, Robert, MD;  Location: [facility];  Service: Orthopedics;  Laterality: Right;  PRE/POST OP SCALENE   Social History   Socioeconomic History   Marital status: Married    Spouse name: Not on file   Number of children: Not on file   Years of education: Not on file   Highest education level: Not on file  Occupational History   Not on file  Tobacco Use   Smoking status: Former    Pack years: 0.00   Smokeless tobacco: Never  Vaping Use   Vaping Use: Never used  Substance and Sexual Activity   Alcohol use: Yes    Alcohol/week: 0.0 standard drinks    Comment: occas   Drug use: No   Sexual activity: Not on file  Other Topics Concern   Not on file  Social History Narrative   Not on file   Social Determinants of Health   Financial Resource Strain: Not on file  Food Insecurity: Not on file  Transportation Needs: Not on file  Physical Activity: Not on file  Stress: Not on file  Social Connections: Not on file   Outpatient Encounter Medications as of 07/30/2020  Medication Sig   B-D ULTRAFINE III  SHORT PEN 31G X 8 MM MISC USE AS DIRECTED   glucose blood (TRUE METRIX BLOOD GLUCOSE TEST) test strip Test BG 2 x daily. e11.65   insulin degludec (TRESIBA FLEXTOUCH) 100 UNIT/ML FlexTouch Pen Inject 80 Units into the skin at bedtime.   JANUMET 50-1000 MG tablet TAKE (1) TABLET TWICE A DAY WITH FOOD---BREAKFAST AND SUPPER.   lisinopril (ZESTRIL) 30 MG tablet Take 1 tablet (30 mg total) by mouth daily.   simvastatin (ZOCOR) 40 MG tablet Take 1 tablet (40 mg total) by mouth every evening.   [DISCONTINUED] simvastatin (ZOCOR) 40 MG tablet TAKE 1 TABLET BY MOUTH ONCE DAILY IN THE EVENING   No facility-administered encounter medications on file as of 07/30/2020.   ALLERGIES: No Known Allergies VACCINATION STATUS:  There is no immunization history on file for this patient.  Diabetes He presents for his follow-up diabetic visit. He has type 2 diabetes mellitus. Onset time: He was diagnosed at approximate age of 37 years. His disease course has been fluctuating. There are no hypoglycemic associated symptoms. Pertinent negatives for hypoglycemia include no confusion, headaches, pallor or seizures. Associated symptoms include polydipsia and polyuria. Pertinent negatives for diabetes include no chest pain, no fatigue, no polyphagia and no weakness. There are no hypoglycemic complications. Symptoms are stable. There are no diabetic complications. Risk factors for coronary artery disease include diabetes  mellitus, dyslipidemia, hypertension, family history, obesity, sedentary lifestyle, tobacco exposure and male sex. Current diabetic treatment includes oral agent (dual therapy) (He is currently on Tresiba 40 units, Janumet 50/1000 mg p.o. twice daily.). He is compliant with treatment most of the time. His weight is increasing steadily. He is following a generally unhealthy diet. When asked about meal planning, he reported none. He has had a previous visit with a dietitian. He participates in exercise  intermittently. His home blood glucose trend is fluctuating minimally. His breakfast blood glucose range is generally 130-140 mg/dl. His overall blood glucose range is 130-140 mg/dl. (He presents with average blood glucose of 142-156 over the last 30 days.  His point-of-care A1c 7.8%, largely unchanged from his last visit A1c of 8.1%.  He did not document or report hypoglycemia.   ) An ACE inhibitor/angiotensin II receptor blocker is being taken. Eye exam is current.  Hyperlipidemia This is a chronic problem. The current episode started more than 1 year ago. The problem is controlled. Exacerbating diseases include diabetes and obesity. Pertinent negatives include no chest pain, myalgias or shortness of breath. Current antihyperlipidemic treatment includes statins. Risk factors for coronary artery disease include diabetes mellitus, dyslipidemia, hypertension, male sex, a sedentary lifestyle and obesity.  Hypertension This is a chronic problem. The current episode started more than 1 year ago. The problem is controlled. Pertinent negatives include no chest pain, headaches, neck pain, palpitations or shortness of breath. Risk factors for coronary artery disease include dyslipidemia, diabetes mellitus, obesity, sedentary lifestyle and male gender. Past treatments include ACE inhibitors.    Review of Systems  Constitutional:  Negative for fatigue and unexpected weight change.  HENT:  Negative for dental problem, mouth sores and trouble swallowing.   Eyes:  Negative for visual disturbance.  Respiratory:  Negative for cough, choking, chest tightness, shortness of breath and wheezing.   Cardiovascular:  Negative for chest pain, palpitations and leg swelling.  Gastrointestinal:  Negative for abdominal distention, abdominal pain, constipation, diarrhea, nausea and vomiting.  Endocrine: Positive for polydipsia and polyuria. Negative for polyphagia.  Genitourinary:  Negative for dysuria, flank pain, hematuria  and urgency.  Musculoskeletal:  Negative for back pain, gait problem, myalgias and neck pain.  Skin:  Negative for pallor, rash and wound.  Neurological:  Negative for seizures, syncope, weakness, numbness and headaches.  Psychiatric/Behavioral: Negative.  Negative for confusion and dysphoric mood.    Objective:    BP (!) 142/84   Pulse (!) 56   Ht  (1.88 m)   Wt 286 lb (129.7 kg)   BMI 36.72 kg/m   Wt Readings from Last 3 Encounters:  07/30/20 286 lb (129.7 kg)  04/28/20 291 lb 12.8 oz (132.4 kg)  12/30/19 290 lb 6.4 oz (131.7 kg)    Physical Exam Constitutional:      General: He is not in acute distress.    Appearance: He is well-developed.  HENT:     Head: Normocephalic and atraumatic.  Neck:     Thyroid: No thyromegaly.     Trachea: No tracheal deviation.  Cardiovascular:     Rate and Rhythm: Normal rate.     Pulses:          Dorsalis pedis pulses are 1+ on the right side and 1+ on the left side.       Posterior tibial pulses are 1+ on the right side and 1+ on the left side.     Heart sounds: S1 normal and S2 normal. No murmur  heard.   No gallop.  Pulmonary:     Effort: Pulmonary effort is normal. No respiratory distress.     Breath sounds: No wheezing.  Abdominal:     General: There is no distension.     Tenderness: There is no abdominal tenderness. There is no guarding.  Musculoskeletal:     Right shoulder: No swelling or deformity.     Cervical back: Normal range of motion and neck supple.  Skin:    General: Skin is warm and dry.     Findings: No rash.     Nails: There is no clubbing.  Neurological:     Mental Status: He is alert and oriented to person, place, and time.     Cranial Nerves: No cranial nerve deficit.     Sensory: No sensory deficit.     Gait: Gait normal.     Deep Tendon Reflexes: Reflexes are normal and symmetric.  Psychiatric:        Speech: Speech normal.        Behavior: Behavior is cooperative.        Judgment: Judgment normal.     Results for orders placed or performed in visit on 07/30/20  HgB A1c  Result Value Ref Range   Hemoglobin A1C     HbA1c POC (<> result, manual entry)     HbA1c, POC (prediabetic range)     HbA1c, POC (controlled diabetic range) 7.8 (A) 0.0 - 7.0 %   Complete Blood Count (Most recent): Lab Results  Component Value Date   WBC 8.8 08/29/2018   HGB 16.6 08/29/2018   HCT 48.0 08/29/2018   MCV 88.2 08/29/2018   PLT PLATELET CLUMPS NOTED ON SMEAR, UNABLE TO ESTIMATE 08/29/2018   Chemistry (most recent): Lab Results  Component Value Date   NA 138 07/21/2020   K 4.7 07/21/2020   CL 102 07/21/2020   CO2 20 07/21/2020   BUN 25 (H) 07/21/2020   CREATININE 0.80 07/21/2020   Diabetic Labs (most recent): Lab Results  Component Value Date   HGBA1C 7.8 (A) 07/30/2020   HGBA1C 8.1 (A) 04/28/2020   HGBA1C 8.2 (A) 12/30/2019   Lipid Panel     Component Value Date/Time   CHOL 188 07/21/2020 0822   TRIG 73 07/21/2020 0822   HDL 34 (L) 07/21/2020 0822   CHOLHDL 5.5 (H) 07/21/2020 0822   CHOLHDL 5.5 (H) 07/18/2019 0954   VLDL 10 02/12/2016 0834   LDLCALC 140 (H) 07/21/2020 0822   LDLCALC 107 (H) 07/18/2019 0954     Assessment & Plan:   1. Uncontrolled type 2 diabetes mellitus with complication, without long-term current use of insulin (HCC)  He presents with average blood glucose of 142-156 over the last 30 days.  His point-of-care A1c 7.8%, largely unchanged from his last visit A1c of 8.1%.  He did not document or report hypoglycemia.    -  he remains at a high risk for more acute and chronic complications of diabetes which include CAD, CVA, CKD, retinopathy, and neuropathy. These are all discussed in detail with the patient.   Recent labs reviewed.   - I have re-counseled the patient on diet management and weight loss  by adopting a carbohydrate restricted / protein rich  Diet.  - he acknowledges that there is a room for improvement in his food and drink choices. -  Suggestion is made for him to avoid simple carbohydrates  from his diet including Cakes, Sweet Desserts, Ice Cream, Soda (diet and regular),  Sweet Tea, Candies, Chips, Cookies, Store Bought Juices, Alcohol in Excess of  1-2 drinks a day, Artificial Sweeteners,  Coffee Creamer, and "Sugar-free" Products, Lemonade. This will help patient to have more stable blood glucose profile and potentially avoid unintended weight gain.   - Patient is advised to stick to a routine mealtimes to eat 3 meals  a day and avoid unnecessary snacks ( to snack only to correct hypoglycemia).  - He will continue to need at least basal insulin in order for him to maintain control of diabetes to target.    -He is advised to continue Tresiba 80 units nightly, continue to monitor blood glucose twice a day-daily before breakfast and at bedtime.  -He is encouraged to call clinic if he registers less than 70 or greater than 150 x 3. -He is advised to continue Janumet 50/1000 mg p.o. twice daily- after breakfast and after supper.     - Patient specific target  for A1c; LDL, HDL, Triglycerides,  were discussed in detail.  2) BP/HTN: His blood pressure is not controlled to target.  He is advised to continue lisinopril 30 mg p.o. daily at  breakfast.  He is advised on salt restrictions.     3) Lipids/HPL: His recent lipid panel showed worsening LDL at 147.  He still reports inconsistency taking his simvastatin.  He is urged to resume his simvastatin and take it consistently, 40 mg p.o. nightly. Plant predominant Whole Foods lifestyle nutrition is discussed and recommended to him.  4)  Weight/Diet: His BMI is 36.7-clearly complicating his diabetes care.  He is a candidate for modest weight loss.    CDE consult in progress, exercise, and carbohydrates information provided.  5) Chronic Care/Health Maintenance:  -Patient is on ACEI/ARB and Statin medications and encouraged to continue to follow up with Ophthalmology, Podiatrist  at least yearly or according to recommendations, and advised to  stay away from smoking. I have recommended yearly flu vaccine and pneumonia vaccination at least every 5 years; moderate intensity exercise for up to 150 minutes weekly; and  sleep for at least 7 hours a day.   His recent ABI was negative for PAD on April 28, 2020.  The study will be repeated in March 2027, or sooner if needed.  I advised patient to maintain close follow up with his PCP for primary care needs.    I spent 44 minutes in the care of the patient today including review of labs from CMP, Lipids, Thyroid Function, Hematology (current and previous including abstractions from other facilities); face-to-face time discussing  his blood glucose readings/logs, discussing hypoglycemia and hyperglycemia episodes and symptoms, medications doses, his options of short and long term treatment based on the latest standards of care / guidelines;  discussion about incorporating lifestyle medicine;  and documenting the encounter.    Please refer to Patient Instructions for Blood Glucose Monitoring and Insulin/Medications Dosing Guide"  in media tab for additional information. Please  also refer to " Patient Self Inventory" in the Media  tab for reviewed elements of pertinent patient history.  Preston Shoe participated in the discussions, expressed understanding, and voiced agreement with the above plans.  All questions were answered to his satisfaction. he is encouraged to contact clinic should he have any questions or concerns prior to his return visit.     Follow up plan: Return in about 4 months (around 11/29/2020) for Bring Meter and Logs- A1c in Office.  Marquis Lunch, MD Phone: [phone]  Fax: [phone]  This note was partially dictated with voice recognition software. Similar sounding words can be transcribed inadequately or may not  be corrected upon review.  07/30/2020, 4:21 PM

## 2020-08-02 ENCOUNTER — Other Ambulatory Visit: Payer: Self-pay | Admitting: "Endocrinology

## 2020-09-07 ENCOUNTER — Other Ambulatory Visit: Payer: Self-pay | Admitting: "Endocrinology

## 2020-09-07 DIAGNOSIS — E782 Mixed hyperlipidemia: Secondary | ICD-10-CM

## 2020-09-07 DIAGNOSIS — I10 Essential (primary) hypertension: Secondary | ICD-10-CM

## 2020-09-08 ENCOUNTER — Other Ambulatory Visit: Payer: Self-pay | Admitting: "Endocrinology

## 2020-09-15 ENCOUNTER — Other Ambulatory Visit: Payer: Self-pay | Admitting: "Endocrinology

## 2020-09-15 DIAGNOSIS — E1165 Type 2 diabetes mellitus with hyperglycemia: Secondary | ICD-10-CM

## 2020-09-15 DIAGNOSIS — IMO0002 Reserved for concepts with insufficient information to code with codable children: Secondary | ICD-10-CM

## 2020-10-26 ENCOUNTER — Other Ambulatory Visit: Payer: Self-pay | Admitting: "Endocrinology

## 2020-10-26 DIAGNOSIS — E782 Mixed hyperlipidemia: Secondary | ICD-10-CM

## 2020-11-25 ENCOUNTER — Other Ambulatory Visit: Payer: Self-pay | Admitting: "Endocrinology

## 2020-11-29 ENCOUNTER — Ambulatory Visit: Payer: BC Managed Care – PPO | Admitting: "Endocrinology

## 2020-11-29 ENCOUNTER — Encounter: Payer: Self-pay | Admitting: "Endocrinology

## 2020-11-29 VITALS — BP 118/78 | HR 64 | Ht 74.0 in | Wt 286.0 lb

## 2020-11-29 DIAGNOSIS — E782 Mixed hyperlipidemia: Secondary | ICD-10-CM | POA: Diagnosis not present

## 2020-11-29 DIAGNOSIS — I10 Essential (primary) hypertension: Secondary | ICD-10-CM | POA: Diagnosis not present

## 2020-11-29 DIAGNOSIS — E119 Type 2 diabetes mellitus without complications: Secondary | ICD-10-CM

## 2020-11-29 LAB — POCT GLYCOSYLATED HEMOGLOBIN (HGB A1C): HbA1c, POC (controlled diabetic range): 6.7 % (ref 0.0–7.0)

## 2020-11-29 MED ORDER — TRESIBA FLEXTOUCH 100 UNIT/ML ~~LOC~~ SOPN: PEN_INJECTOR | SUBCUTANEOUS | 1 refills | Status: DC

## 2020-11-29 MED ORDER — BD PEN NEEDLE SHORT U/F 31G X 8 MM MISC: 2 refills | Status: DC

## 2020-11-29 MED ORDER — TRUE METRIX BLOOD GLUCOSE TEST VI STRP: ORAL_STRIP | 2 refills | Status: DC

## 2020-11-29 MED ORDER — JANUMET 50-1000 MG PO TABS: 1.0000 | ORAL_TABLET | Freq: Two times a day (BID) | ORAL | 1 refills | Status: DC

## 2020-11-29 MED ORDER — SIMVASTATIN 40 MG PO TABS: 40.0000 mg | ORAL_TABLET | Freq: Every evening | ORAL | 1 refills | Status: DC

## 2020-11-29 MED ORDER — LISINOPRIL 30 MG PO TABS: 30.0000 mg | ORAL_TABLET | Freq: Every day | ORAL | 1 refills | Status: DC

## 2020-11-29 NOTE — Progress Notes (Signed)
07/30/2020  Endocrinology follow-up note   Subjective:    Patient ID: Preston Mack, male    DOB: 1965-11-06,    Past Medical History:  Diagnosis Date   Diabetes mellitus, type II (HCC)    Hyperlipidemia    Sleep apnea    no cpap in ten years   Traumatic rotator cuff tear, right, initial encounter 07/24/2018   Past Surgical History:  Procedure Laterality Date   BICEPT TENODESIS Right 10/02/2018   Procedure: BICEPS TENODESIS;  Surgeon: Salvatore Marvel, MD;  Location: [facility];  Service: Orthopedics;  Laterality: Right;   Lipoma removal     Osteophyte of bone     SHOULDER ARTHROSCOPY WITH SUBACROMIAL DECOMPRESSION, ROTATOR CUFF REPAIR AND BICEP TENDON REPAIR Right 10/02/2018   Procedure: RIGHT SHOULDER ARTHROSCOPY DEBRIDEMENT,  WITH SUBACROMIAL DECOMPRESSION,  DISTAL CLAVICAL EXCISION, ROTATOR CUFF REPAIR AND  BICEP TENODESIS;  Surgeon: Salvatore Marvel, MD;  Location: [facility];  Service: Orthopedics;  Laterality: Right;  PRE/POST OP SCALENE   Social History   Socioeconomic History   Marital status: Married    Spouse name: Not on file   Number of children: Not on file   Years of education: Not on file   Highest education level: Not on file  Occupational History   Not on file  Tobacco Use   Smoking status: Former    Pack years: 0.00   Smokeless tobacco: Never  Vaping Use   Vaping Use: Never used  Substance and Sexual Activity   Alcohol use: Yes    Alcohol/week: 0.0 standard drinks    Comment: occas   Drug use: No   Sexual activity: Not on file  Other Topics Concern   Not on file  Social History Narrative   Not on file   Social Determinants of Health   Financial Resource Strain: Not on file  Food Insecurity: Not on file  Transportation Needs: Not on file  Physical Activity: Not on file  Stress: Not on file  Social Connections: Not on file   Outpatient Encounter Medications as of 07/30/2020  Medication Sig   B-D ULTRAFINE III  SHORT PEN 31G X 8 MM MISC USE AS DIRECTED   glucose blood (TRUE METRIX BLOOD GLUCOSE TEST) test strip Test BG 2 x daily. e11.65   insulin degludec (TRESIBA FLEXTOUCH) 100 UNIT/ML FlexTouch Pen Inject 80 Units into the skin at bedtime.   JANUMET 50-1000 MG tablet TAKE (1) TABLET TWICE A DAY WITH FOOD---BREAKFAST AND SUPPER.   lisinopril (ZESTRIL) 30 MG tablet Take 1 tablet (30 mg total) by mouth daily.   simvastatin (ZOCOR) 40 MG tablet Take 1 tablet (40 mg total) by mouth every evening.   [DISCONTINUED] simvastatin (ZOCOR) 40 MG tablet TAKE 1 TABLET BY MOUTH ONCE DAILY IN THE EVENING   No facility-administered encounter medications on file as of 07/30/2020.   ALLERGIES: No Known Allergies VACCINATION STATUS:  There is no immunization history on file for this patient.  Diabetes He presents for his follow-up diabetic visit. He has type 2 diabetes mellitus. Onset time: He was diagnosed at approximate age of 37 years. His disease course has been improving. There are no hypoglycemic associated symptoms. Pertinent negatives for hypoglycemia include no confusion, headaches, pallor or seizures. Pertinent negatives for diabetes include no chest pain, no fatigue, no polydipsia, no polyphagia, no polyuria and no weakness. There are no hypoglycemic complications. Symptoms are improving. There are no diabetic complications. Risk factors for coronary artery disease include diabetes mellitus, dyslipidemia,  hypertension, family history, obesity, sedentary lifestyle, tobacco exposure and male sex. Current diabetic treatment includes oral agent (dual therapy) (He is currently on Tresiba 40 units, Janumet 50/1000 mg p.o. twice daily.). He is compliant with treatment most of the time. His weight is increasing steadily. He is following a generally unhealthy diet. When asked about meal planning, he reported none. He has had a previous visit with a dietitian. He participates in exercise intermittently. His home blood  glucose trend is decreasing steadily. His breakfast blood glucose range is generally 110-130 mg/dl. His overall blood glucose range is 130-140 mg/dl. (He presents with improved glycemic profile averaging 117-1 29.  His point-of-care A1c is 6.7%, overall improving from 8.1%.   ) An ACE inhibitor/angiotensin II receptor blocker is being taken. Eye exam is current.  Hyperlipidemia This is a chronic problem. The current episode started more than 1 year ago. The problem is controlled. Exacerbating diseases include diabetes and obesity. Pertinent negatives include no chest pain, myalgias or shortness of breath. Current antihyperlipidemic treatment includes statins. Risk factors for coronary artery disease include diabetes mellitus, dyslipidemia, hypertension, male sex, a sedentary lifestyle and obesity.  Hypertension This is a chronic problem. The current episode started more than 1 year ago. The problem is controlled. Pertinent negatives include no chest pain, headaches, neck pain, palpitations or shortness of breath. Risk factors for coronary artery disease include dyslipidemia, diabetes mellitus, obesity, sedentary lifestyle and male gender. Past treatments include ACE inhibitors.    Review of Systems  Constitutional:  Negative for fatigue and unexpected weight change.  HENT:  Negative for dental problem, mouth sores and trouble swallowing.   Eyes:  Negative for visual disturbance.  Respiratory:  Negative for cough, choking, chest tightness, shortness of breath and wheezing.   Cardiovascular:  Negative for chest pain, palpitations and leg swelling.  Gastrointestinal:  Negative for abdominal distention, abdominal pain, constipation, diarrhea, nausea and vomiting.  Endocrine: Negative for polydipsia, polyphagia and polyuria.  Genitourinary:  Negative for dysuria, flank pain, hematuria and urgency.  Musculoskeletal:  Negative for back pain, gait problem, myalgias and neck pain.  Skin:  Negative for  pallor, rash and wound.  Neurological:  Negative for seizures, syncope, weakness, numbness and headaches.  Psychiatric/Behavioral: Negative.  Negative for confusion and dysphoric mood.    Objective:    BP (!) 142/84   Pulse (!) 56   Ht 6\' 2"  (1.88 m)   Wt 286 lb (129.7 kg)   BMI 36.72 kg/m   Wt Readings from Last 3 Encounters:  07/30/20 286 lb (129.7 kg)  04/28/20 291 lb 12.8 oz (132.4 kg)  12/30/19 290 lb 6.4 oz (131.7 kg)    Physical Exam Constitutional:      General: He is not in acute distress.    Appearance: He is well-developed.  HENT:     Head: Normocephalic and atraumatic.  Neck:     Thyroid: No thyromegaly.     Trachea: No tracheal deviation.  Cardiovascular:     Rate and Rhythm: Normal rate.     Pulses:          Dorsalis pedis pulses are 1+ on the right side and 1+ on the left side.       Posterior tibial pulses are 1+ on the right side and 1+ on the left side.     Heart sounds: S1 normal and S2 normal. No murmur heard.   No gallop.  Pulmonary:     Effort: Pulmonary effort is normal. No respiratory distress.  Breath sounds: No wheezing.  Abdominal:     General: There is no distension.     Tenderness: There is no abdominal tenderness. There is no guarding.  Musculoskeletal:     Right shoulder: No swelling or deformity.     Cervical back: Normal range of motion and neck supple.  Skin:    General: Skin is warm and dry.     Findings: No rash.     Nails: There is no clubbing.  Neurological:     Mental Status: He is alert and oriented to person, place, and time.     Cranial Nerves: No cranial nerve deficit.     Sensory: No sensory deficit.     Gait: Gait normal.     Deep Tendon Reflexes: Reflexes are normal and symmetric.  Psychiatric:        Speech: Speech normal.        Behavior: Behavior is cooperative.        Judgment: Judgment normal.    Results for orders placed or performed in visit on 07/30/20  HgB A1c  Result Value Ref Range   Hemoglobin  A1C     HbA1c POC (<> result, manual entry)     HbA1c, POC (prediabetic range)     HbA1c, POC (controlled diabetic range) 7.8 (A) 0.0 - 7.0 %   Complete Blood Count (Most recent): Lab Results  Component Value Date   WBC 8.8 08/29/2018   HGB 16.6 08/29/2018   HCT 48.0 08/29/2018   MCV 88.2 08/29/2018   PLT PLATELET CLUMPS NOTED ON SMEAR, UNABLE TO ESTIMATE 08/29/2018   Chemistry (most recent): Lab Results  Component Value Date   NA 138 07/21/2020   K 4.7 07/21/2020   CL 102 07/21/2020   CO2 20 07/21/2020   BUN 25 (H) 07/21/2020   CREATININE 0.80 07/21/2020   Diabetic Labs (most recent): Lab Results  Component Value Date   HGBA1C 7.8 (A) 07/30/2020   HGBA1C 8.1 (A) 04/28/2020   HGBA1C 8.2 (A) 12/30/2019   Lipid Panel     Component Value Date/Time   CHOL 188 07/21/2020 0822   TRIG 73 07/21/2020 0822   HDL 34 (L) 07/21/2020 0822   CHOLHDL 5.5 (H) 07/21/2020 0822   CHOLHDL 5.5 (H) 07/18/2019 0954   VLDL 10 02/12/2016 0834   LDLCALC 140 (H) 07/21/2020 0822   LDLCALC 107 (H) 07/18/2019 0954     Assessment & Plan:   1. Uncontrolled type 2 diabetes mellitus with complication, without long-term current use of insulin (HCC)  He presents with improved glycemic profile averaging 117-1 29.  His point-of-care A1c is 6.7%, overall improving from 8.1%.    -  he remains at a high risk for more acute and chronic complications of diabetes which include CAD, CVA, CKD, retinopathy, and neuropathy. These are all discussed in detail with the patient.   Recent labs reviewed.   - I have re-counseled the patient on diet management and weight loss  by adopting a carbohydrate restricted / protein rich  Diet.  - he acknowledges that there is a room for improvement in his food and drink choices. - Suggestion is made for him to avoid simple carbohydrates  from his diet including Cakes, Sweet Desserts, Ice Cream, Soda (diet and regular), Sweet Tea, Candies, Chips, Cookies, Store Bought  Juices, Alcohol in Excess of  1-2 drinks a day, Artificial Sweeteners,  Coffee Creamer, and "Sugar-free" Products, Lemonade. This will help patient to have more stable blood glucose profile and potentially  avoid unintended weight gain.   - Patient is advised to stick to a routine mealtimes to eat 3 meals  a day and avoid unnecessary snacks ( to snack only to correct hypoglycemia).  - He will continue to need at least basal insulin in order for him to maintain control of diabetes to target.    -He is advised to continue Tresiba 80 units nightly,  continue to monitor blood glucose twice a day-daily before breakfast and at bedtime.  -He is encouraged to call clinic if he registers less than 70 or greater than 150 x 3. -He is advised to continue Janumet 50/1000 mg p.o. twice daily- after breakfast and after supper.     - Patient specific target  for A1c; LDL, HDL, Triglycerides,  were discussed in detail.  2) BP/HTN:  His blood pressure is controlled to target.  He is advised to continue lisinopril 30 mg p.o. daily at  breakfast.  He is advised on salt restrictions.     3) Lipids/HPL: His recent lipid panel showed worsening LDL at 147.  He still reports inconsistency taking his simvastatin.  He is urged to resume his simvastatin and take it consistently, 40 mg p.o. nightly. Plant predominant Whole Foods lifestyle nutrition is discussed and recommended to him.  4)  Weight/Diet: His BMI is 36.7--clearly complicating his diabetes care.  He is a candidate for modest weight loss.    CDE consult in progress, exercise, and carbohydrates information provided.  5) Chronic Care/Health Maintenance:  -Patient is on ACEI/ARB and Statin medications and encouraged to continue to follow up with Ophthalmology, Podiatrist at least yearly or according to recommendations, and advised to  stay away from smoking. I have recommended yearly flu vaccine and pneumonia vaccination at least every 5 years; moderate  intensity exercise for up to 150 minutes weekly; and  sleep for at least 7 hours a day.   His recent ABI was negative for PAD on April 28, 2020.  The study will be repeated in March 2027, or sooner if needed.  I advised patient to maintain close follow up with his PCP for primary care needs.   I spent 40 minutes in the care of the patient today including review of labs from CMP, Lipids, Thyroid Function, Hematology (current and previous including abstractions from other facilities); face-to-face time discussing  his blood glucose readings/logs, discussing hypoglycemia and hyperglycemia episodes and symptoms, medications doses, his options of short and long term treatment based on the latest standards of care / guidelines;  discussion about incorporating lifestyle medicine;  and documenting the encounter.    Please refer to Patient Instructions for Blood Glucose Monitoring and Insulin/Medications Dosing Guide"  in media tab for additional information. Please  also refer to " Patient Self Inventory" in the Media  tab for reviewed elements of pertinent patient history.  Preston Mack participated in the discussions, expressed understanding, and voiced agreement with the above plans.  All questions were answered to his satisfaction. he is encouraged to contact clinic should he have any questions or concerns prior to his return visit.    Follow up plan: Return in about 4 months (around 11/29/2020) for Bring Meter and Logs- A1c in Office.  Marquis Lunch, MD Phone: [phone]  Fax: [phone]  This note was partially dictated with voice recognition software. Similar sounding words can be transcribed inadequately or may not  be corrected upon review.  07/30/2020, 4:21 PM

## 2020-11-29 NOTE — Patient Instructions (Signed)

## 2020-12-20 ENCOUNTER — Other Ambulatory Visit: Payer: Self-pay | Admitting: "Endocrinology

## 2021-02-07 ENCOUNTER — Other Ambulatory Visit: Payer: Self-pay | Admitting: "Endocrinology

## 2021-02-08 ENCOUNTER — Other Ambulatory Visit: Payer: Self-pay

## 2021-02-08 MED ORDER — TRESIBA FLEXTOUCH 200 UNIT/ML ~~LOC~~ SOPN: 80.0000 [IU] | PEN_INJECTOR | Freq: Every evening | SUBCUTANEOUS | 1 refills | Status: DC

## 2021-03-01 ENCOUNTER — Ambulatory Visit: Payer: BC Managed Care – PPO | Admitting: "Endocrinology

## 2021-03-01 LAB — COMPREHENSIVE METABOLIC PANEL
ALT: 23 IU/L (ref 0–44)
AST: 19 IU/L (ref 0–40)
Albumin/Globulin Ratio: 2.8 — ABNORMAL HIGH (ref 1.2–2.2)
Albumin: 4.5 g/dL (ref 3.8–4.9)
Alkaline Phosphatase: 39 IU/L — ABNORMAL LOW (ref 44–121)
BUN/Creatinine Ratio: 19 (ref 9–20)
BUN: 17 mg/dL (ref 6–24)
Bilirubin Total: 0.4 mg/dL (ref 0.0–1.2)
CO2: 25 mmol/L (ref 20–29)
Calcium: 9.4 mg/dL (ref 8.7–10.2)
Chloride: 105 mmol/L (ref 96–106)
Creatinine, Ser: 0.88 mg/dL (ref 0.76–1.27)
Globulin, Total: 1.6 g/dL (ref 1.5–4.5)
Glucose: 153 mg/dL — ABNORMAL HIGH (ref 70–99)
Potassium: 4.4 mmol/L (ref 3.5–5.2)
Sodium: 144 mmol/L (ref 134–144)
Total Protein: 6.1 g/dL (ref 6.0–8.5)
eGFR: 103 mL/min/{1.73_m2} (ref >59)

## 2021-03-08 ENCOUNTER — Other Ambulatory Visit: Payer: Self-pay

## 2021-03-08 ENCOUNTER — Ambulatory Visit: Payer: BC Managed Care – PPO | Admitting: "Endocrinology

## 2021-03-08 ENCOUNTER — Encounter: Payer: Self-pay | Admitting: "Endocrinology

## 2021-03-08 VITALS — BP 120/78 | HR 64 | Ht 74.0 in | Wt 289.4 lb

## 2021-03-08 DIAGNOSIS — I10 Essential (primary) hypertension: Secondary | ICD-10-CM | POA: Diagnosis not present

## 2021-03-08 DIAGNOSIS — E782 Mixed hyperlipidemia: Secondary | ICD-10-CM

## 2021-03-08 DIAGNOSIS — E119 Type 2 diabetes mellitus without complications: Secondary | ICD-10-CM

## 2021-03-08 LAB — POCT GLYCOSYLATED HEMOGLOBIN (HGB A1C): HbA1c, POC (controlled diabetic range): 7.7 % — AB (ref 0.0–7.0)

## 2021-03-08 NOTE — Progress Notes (Signed)
03/08/2021  Endocrinology follow-up note   Subjective:    Patient ID: Preston Mack, male    DOB: April 26, 1965,    Past Medical History:  Diagnosis Date   Diabetes mellitus, type II ([place])    Hyperlipidemia    Sleep apnea    no cpap in ten years   Traumatic rotator cuff tear, right, initial encounter 07/24/2018   Past Surgical History:  Procedure Laterality Date   BICEPT TENODESIS Right 10/02/2018   Procedure: BICEPS TENODESIS;  Surgeon: Elsie Saas, MD;  Location: [facility];  Service: Orthopedics;  Laterality: Right;   Lipoma removal     Osteophyte of bone     SHOULDER ARTHROSCOPY WITH SUBACROMIAL DECOMPRESSION, ROTATOR CUFF REPAIR AND BICEP TENDON REPAIR Right 10/02/2018   Procedure: RIGHT SHOULDER ARTHROSCOPY DEBRIDEMENT,  WITH SUBACROMIAL DECOMPRESSION,  DISTAL CLAVICAL EXCISION, ROTATOR CUFF REPAIR AND  BICEP TENODESIS;  Surgeon: Elsie Saas, MD;  Location: [facility];  Service: Orthopedics;  Laterality: Right;  PRE/POST OP SCALENE   Social History   Socioeconomic History   Marital status: Married    Spouse name: Not on file   Number of children: Not on file   Years of education: Not on file   Highest education level: Not on file  Occupational History   Not on file  Tobacco Use   Smoking status: Former   Smokeless tobacco: Never  Vaping Use   Vaping Use: Never used  Substance and Sexual Activity   Alcohol use: Yes    Alcohol/week: 0.0 standard drinks    Comment: occas   Drug use: No   Sexual activity: Not on file  Other Topics Concern   Not on file  Social History Narrative   Not on file   Social Determinants of Health   Financial Resource Strain: Not on file  Food Insecurity: Not on file  Transportation Needs: Not on file  Physical Activity: Not on file  Stress: Not on file  Social Connections: Not on file   Outpatient Encounter Medications as of 03/08/2021  Medication Sig   escitalopram (LEXAPRO) 10 MG tablet Take  10 mg by mouth daily.   glucose blood (TRUE METRIX BLOOD GLUCOSE TEST) test strip CHECK BLOOD SUGAR TWICE DAILY.   insulin degludec (TRESIBA FLEXTOUCH) 200 UNIT/ML FlexTouch Pen Inject 80 Units into the skin at bedtime.   Insulin Pen Needle (B-D ULTRAFINE III SHORT PEN) 31G X 8 MM MISC USE AS DIRECTED   JANUMET 50-1000 MG tablet TAKE (1) TABLET TWICE A DAY WITH FOOD---BREAKFAST AND SUPPER.   lisinopril (ZESTRIL) 30 MG tablet Take 1 tablet (30 mg total) by mouth daily.   simvastatin (ZOCOR) 40 MG tablet Take 1 tablet (40 mg total) by mouth every evening.   [DISCONTINUED] TRESIBA FLEXTOUCH 100 UNIT/ML FlexTouch Pen INJECT 0.8MLS (80 UNITS TOTAL) INTO THE SKIN AT BEDTIME.   No facility-administered encounter medications on file as of 03/08/2021.   ALLERGIES: No Known Allergies VACCINATION STATUS:  There is no immunization history on file for this patient.  Diabetes He presents for his follow-up diabetic visit. He has type 2 diabetes mellitus. Onset time: He was diagnosed at approximate age of 21 years. His disease course has been worsening. There are no hypoglycemic associated symptoms. Pertinent negatives for hypoglycemia include no confusion, headaches, pallor or seizures. Pertinent negatives for diabetes include no chest pain, no fatigue, no polydipsia, no polyphagia, no polyuria and no weakness. There are no hypoglycemic complications. Symptoms are worsening. There are no diabetic complications.  Risk factors for coronary artery disease include diabetes mellitus, dyslipidemia, hypertension, family history, obesity, sedentary lifestyle, tobacco exposure and male sex. Current diabetic treatment includes oral agent (dual therapy) (He is currently on Tresiba 40 units, Janumet 50/1000 mg p.o. twice daily.). He is compliant with treatment most of the time. His weight is fluctuating minimally. He is following a generally unhealthy diet. When asked about meal planning, he reported none. He has had a  previous visit with a dietitian. He participates in exercise intermittently. His home blood glucose trend is decreasing steadily. His breakfast blood glucose range is generally 130-140 mg/dl. His overall blood glucose range is 130-140 mg/dl. Preston Mack presents with increased glycemic profile compared to last visit.  This is due to his exposure to steroid injections since last visit.  His 7-day average is 143, 14 average 152.  His point-of-care A1c 7.7% increasing from 6.7%.  No hypoglycemia was documented or reported.   ) An ACE inhibitor/angiotensin II receptor blocker is being taken. Eye exam is current.  Hyperlipidemia This is a chronic problem. The current episode started more than 1 year ago. The problem is controlled. Exacerbating diseases include diabetes and obesity. Pertinent negatives include no chest pain, myalgias or shortness of breath. Current antihyperlipidemic treatment includes statins. Risk factors for coronary artery disease include diabetes mellitus, dyslipidemia, hypertension, male sex, a sedentary lifestyle and obesity.  Hypertension This is a chronic problem. The current episode started more than 1 year ago. The problem is controlled. Pertinent negatives include no chest pain, headaches, neck pain, palpitations or shortness of breath. Risk factors for coronary artery disease include dyslipidemia, diabetes mellitus, obesity, sedentary lifestyle and male gender. Past treatments include ACE inhibitors.    Review of Systems  Constitutional:  Negative for fatigue and unexpected weight change.  HENT:  Negative for dental problem, mouth sores and trouble swallowing.   Eyes:  Negative for visual disturbance.  Respiratory:  Negative for cough, choking, chest tightness, shortness of breath and wheezing.   Cardiovascular:  Negative for chest pain, palpitations and leg swelling.  Gastrointestinal:  Negative for abdominal distention, abdominal pain, constipation, diarrhea, nausea and vomiting.   Endocrine: Negative for polydipsia, polyphagia and polyuria.  Genitourinary:  Negative for dysuria, flank pain, hematuria and urgency.  Musculoskeletal:  Negative for back pain, gait problem, myalgias and neck pain.  Skin:  Negative for pallor, rash and wound.  Neurological:  Negative for seizures, syncope, weakness, numbness and headaches.  Psychiatric/Behavioral: Negative.  Negative for confusion and dysphoric mood.    Objective:    BP 120/78    Pulse 64    Ht 6\' 2"  (1.88 m)    Wt 289 lb 6.4 oz (131.3 kg)    BMI 37.16 kg/m   Wt Readings from Last 3 Encounters:  03/08/21 289 lb 6.4 oz (131.3 kg)  11/29/20 286 lb (129.7 kg)  07/30/20 286 lb (129.7 kg)      Results for orders placed or performed in visit on 03/08/21  HgB A1c  Result Value Ref Range   Hemoglobin A1C     HbA1c POC (<> result, manual entry)     HbA1c, POC (prediabetic range)     HbA1c, POC (controlled diabetic range) 7.7 (A) 0.0 - 7.0 %   Complete Blood Count (Most recent): Lab Results  Component Value Date   WBC 8.8 08/29/2018   HGB 16.6 08/29/2018   HCT 48.0 08/29/2018   MCV 88.2 08/29/2018   PLT PLATELET CLUMPS NOTED ON SMEAR, UNABLE TO ESTIMATE 08/29/2018  Chemistry (most recent): Lab Results  Component Value Date   NA 144 02/28/2021   K 4.4 02/28/2021   CL 105 02/28/2021   CO2 25 02/28/2021   BUN 17 02/28/2021   CREATININE 0.88 02/28/2021   Diabetic Labs (most recent): Lab Results  Component Value Date   HGBA1C 7.7 (A) 03/08/2021   HGBA1C 6.7 11/29/2020   HGBA1C 7.8 (A) 07/30/2020   Lipid Panel     Component Value Date/Time   CHOL 188 07/21/2020 0822   TRIG 73 07/21/2020 0822   HDL 34 (L) 07/21/2020 0822   CHOLHDL 5.5 (H) 07/21/2020 0822   CHOLHDL 5.5 (H) 07/18/2019 0954   VLDL 10 02/12/2016 0834   LDLCALC 140 (H) 07/21/2020 0822   LDLCALC 107 (H) 07/18/2019 0954     Assessment & Plan:   1. Uncontrolled type 2 diabetes mellitus with complication, without long-term current use of  insulin ([place])  Preston Mack presents with increased glycemic profile compared to last visit.  This is due to his exposure to steroid injections since last visit.  His 7-day average is 143, 14 average 152.  His point-of-care A1c 7.7% increasing from 6.7%.  No hypoglycemia was documented or reported.    -  he remains at a high risk for more acute and chronic complications of diabetes which include CAD, CVA, CKD, retinopathy, and neuropathy. These are all discussed in detail with the patient.   Recent labs reviewed.   - I have re-counseled the patient on diet management and weight loss  by adopting a carbohydrate restricted / protein rich  Diet.  - he acknowledges that there is a room for improvement in his food and drink choices. - Suggestion is made for him to avoid simple carbohydrates  from his diet including Cakes, Sweet Desserts, Ice Cream, Soda (diet and regular), Sweet Tea, Candies, Chips, Cookies, Store Bought Juices, Alcohol , Artificial Sweeteners,  Coffee Creamer, and "Sugar-free" Products, Lemonade. This will help patient to have more stable blood glucose profile and potentially avoid unintended weight gain.  The following Lifestyle Medicine recommendations according to [facility]) were discussed and and offered to patient and he  agrees to start the journey:  A. Whole Foods, Plant-Based Nutrition comprising of fruits and vegetables, plant-based proteins, whole-grain carbohydrates was discussed in detail with the patient.   A list for source of those nutrients were also provided to the patient.  Patient will use only water or unsweetened tea for hydration. B.  The need to stay away from risky substances including alcohol, smoking; obtaining 7 to 9 hours of restorative sleep, at least 150 minutes of moderate intensity exercise weekly, the importance of healthy social connections,  and stress management techniques were discussed. C.  A full color page of   Calorie density of various food groups per pound showing examples of each food groups was provided to the patient.    - Patient is advised to stick to a routine mealtimes to eat 3 meals  a day and avoid unnecessary snacks ( to snack only to correct hypoglycemia).  - He will continue to need at least basal insulin in order for him to maintain control of diabetes to target.  He is advised to continue Tresiba 80 units nightly, associated with monitoring of blood glucose twice a day-daily before breakfast and at bedtime.   -He is advised to continue Janumet 50/1000 mg p.o. twice daily- after breakfast and after supper.   -He is encouraged to call  clinic for blood glucose readings less than 70 or greater than 200 mg per DL at fasting.  - Patient specific target  for A1c; LDL, HDL, Triglycerides,  were discussed in detail.  2) BP/HTN:  -His blood pressure is controlled to target.  He is advised to continue lisinopril 30 mg p.o. daily at  breakfast.  He is advised on salt restrictions.     3) Lipids/HPL: His recent lipid panel showed worsening LDL at 147.  He still reports inconsistency taking his simvastatin.  He is urged to resume his simvastatin and take it consistently, 40 mg p.o. nightly.  The above discussed lifestyle medicine interventions will help with dyslipidemia.   4)  Weight/Diet: His BMI is [id number] complicating his diabetes care.  He is a candidate for modest weight loss.    CDE consult in progress, exercise, and carbohydrates information provided.  5) Chronic Care/Health Maintenance:  -Patient is on ACEI/ARB and Statin medications and encouraged to continue to follow up with Ophthalmology, Podiatrist at least yearly or according to recommendations, and advised to  stay away from smoking. I have recommended yearly flu vaccine and pneumonia vaccination at least every 5 years; moderate intensity exercise for up to 150 minutes weekly; and  sleep for at least 7 hours a  day.   His recent ABI was negative for PAD on April 28, 2020.  The study will be repeated in March 2027, or sooner if needed.  I advised patient to maintain close follow up with his PCP for primary care needs.   I spent 44 minutes in the care of the patient today including review of labs from [place] [facility] [place], Lipids, Thyroid Function, Hematology (current and previous including abstractions from other facilities); face-to-face time discussing  his blood glucose readings/logs, discussing hypoglycemia and hyperglycemia episodes and symptoms, medications doses, his options of short and long term treatment based on the latest standards of care / guidelines;  discussion about incorporating lifestyle medicine;  and documenting the encounter.    Please refer to Patient Instructions for Blood Glucose Monitoring and Insulin/Medications Dosing Guide"  in media tab for additional information. Please  also refer to " Patient Self Inventory" in the Media  tab for reviewed elements of pertinent patient history.  Preston Mack participated in the discussions, expressed understanding, and voiced agreement with the above plans.  All questions were answered to his satisfaction. he is encouraged to contact clinic should he have any questions or concerns prior to his return visit.     Follow up plan: Return in about 4 months (around 07/06/2021) for Bring Meter and Logs- A1c in Office.  Glade Lloyd, MD Phone: [phone]  Fax: [phone]  This note was partially dictated with voice recognition software. Similar sounding words can be transcribed inadequately or may not  be corrected upon review.  03/08/2021, 12:50 PM

## 2021-03-08 NOTE — Patient Instructions (Signed)

## 2021-03-14 ENCOUNTER — Other Ambulatory Visit: Payer: Self-pay | Admitting: "Endocrinology

## 2021-04-14 ENCOUNTER — Other Ambulatory Visit: Payer: Self-pay | Admitting: "Endocrinology

## 2021-06-14 ENCOUNTER — Other Ambulatory Visit: Payer: Self-pay | Admitting: "Endocrinology

## 2021-07-07 ENCOUNTER — Ambulatory Visit: Payer: BC Managed Care – PPO | Admitting: "Endocrinology

## 2021-08-03 ENCOUNTER — Encounter: Payer: Self-pay | Admitting: "Endocrinology

## 2021-08-03 ENCOUNTER — Ambulatory Visit: Payer: BC Managed Care – PPO | Admitting: "Endocrinology

## 2021-08-03 VITALS — BP 126/82 | HR 68 | Ht 74.0 in | Wt 281.8 lb

## 2021-08-03 DIAGNOSIS — Z6836 Body mass index (BMI) 36.0-36.9, adult: Secondary | ICD-10-CM

## 2021-08-03 DIAGNOSIS — E119 Type 2 diabetes mellitus without complications: Secondary | ICD-10-CM

## 2021-08-03 DIAGNOSIS — I10 Essential (primary) hypertension: Secondary | ICD-10-CM

## 2021-08-03 DIAGNOSIS — E782 Mixed hyperlipidemia: Secondary | ICD-10-CM

## 2021-08-03 LAB — POCT GLYCOSYLATED HEMOGLOBIN (HGB A1C): HbA1c, POC (controlled diabetic range): 6.9 % (ref 0.0–7.0)

## 2021-08-03 MED ORDER — TRESIBA FLEXTOUCH 200 UNIT/ML ~~LOC~~ SOPN: 70.0000 [IU] | PEN_INJECTOR | Freq: Every evening | SUBCUTANEOUS | 1 refills | Status: DC

## 2021-08-03 NOTE — Patient Instructions (Signed)

## 2021-08-03 NOTE — Progress Notes (Signed)
08/03/2021  Endocrinology follow-up note   Subjective:    Patient ID: Preston Mack, male    DOB: 1965-12-07,    Past Medical History:  Diagnosis Date   Diabetes mellitus, type II (HCC)    Hyperlipidemia    Sleep apnea    no cpap in ten years   Traumatic rotator cuff tear, right, initial encounter 07/24/2018   Past Surgical History:  Procedure Laterality Date   BICEPT TENODESIS Right 10/02/2018   Procedure: BICEPS TENODESIS;  Surgeon: Salvatore Marvel, MD;  Location: [facility];  Service: Orthopedics;  Laterality: Right;   Lipoma removal     Osteophyte of bone     SHOULDER ARTHROSCOPY WITH SUBACROMIAL DECOMPRESSION, ROTATOR CUFF REPAIR AND BICEP TENDON REPAIR Right 10/02/2018   Procedure: RIGHT SHOULDER ARTHROSCOPY DEBRIDEMENT,  WITH SUBACROMIAL DECOMPRESSION,  DISTAL CLAVICAL EXCISION, ROTATOR CUFF REPAIR AND  BICEP TENODESIS;  Surgeon: Salvatore Marvel, MD;  Location: [facility];  Service: Orthopedics;  Laterality: Right;  PRE/POST OP SCALENE   Social History   Socioeconomic History   Marital status: Married    Spouse name: Not on file   Number of children: Not on file   Years of education: Not on file   Highest education level: Not on file  Occupational History   Not on file  Tobacco Use   Smoking status: Former   Smokeless tobacco: Never  Vaping Use   Vaping Use: Never used  Substance and Sexual Activity   Alcohol use: Yes    Alcohol/week: 0.0 standard drinks of alcohol    Comment: occas   Drug use: No   Sexual activity: Not on file  Other Topics Concern   Not on file  Social History Narrative   Not on file   Social Determinants of Health   Financial Resource Strain: Not on file  Food Insecurity: Not on file  Transportation Needs: Not on file  Physical Activity: Not on file  Stress: Not on file  Social Connections: Not on file   Outpatient Encounter Medications as of 08/03/2021  Medication Sig   escitalopram (LEXAPRO) 10 MG  tablet Take 10 mg by mouth daily.   glucose blood (TRUE METRIX BLOOD GLUCOSE TEST) test strip CHECK BLOOD SUGAR TWICE DAILY.   insulin degludec (TRESIBA FLEXTOUCH) 200 UNIT/ML FlexTouch Pen Inject 70 Units into the skin at bedtime.   Insulin Pen Needle (B-D ULTRAFINE III SHORT PEN) 31G X 8 MM MISC USE AS DIRECTED   JANUMET 50-1000 MG tablet TAKE (1) TABLET TWICE A DAY WITH FOOD---BREAKFAST AND SUPPER.   lisinopril (ZESTRIL) 30 MG tablet Take 1 tablet (30 mg total) by mouth daily.   simvastatin (ZOCOR) 40 MG tablet Take 1 tablet (40 mg total) by mouth every evening.   [DISCONTINUED] TRESIBA FLEXTOUCH 200 UNIT/ML FlexTouch Pen Inject 80 Units into the skin at bedtime.   No facility-administered encounter medications on file as of 08/03/2021.   ALLERGIES: No Known Allergies VACCINATION STATUS:  There is no immunization history on file for this patient.  Diabetes He presents for his follow-up diabetic visit. He has type 2 diabetes mellitus. Onset time: He was diagnosed at approximate age of 37 years. His disease course has been improving. There are no hypoglycemic associated symptoms. Pertinent negatives for hypoglycemia include no confusion, headaches, pallor or seizures. Pertinent negatives for diabetes include no chest pain, no fatigue, no polydipsia, no polyphagia, no polyuria and no weakness. There are no hypoglycemic complications. Symptoms are improving. There are no diabetic complications.  Risk factors for coronary artery disease include diabetes mellitus, dyslipidemia, hypertension, family history, obesity, sedentary lifestyle, tobacco exposure and male sex. Current diabetic treatment includes oral agent (dual therapy) (He is currently on Tresiba 40 units, Janumet 50/1000 mg p.o. twice daily.). He is compliant with treatment most of the time. His weight is fluctuating minimally. He is following a generally unhealthy diet. When asked about meal planning, he reported none. He has had a previous  visit with a dietitian. He participates in exercise intermittently. His home blood glucose trend is decreasing steadily. His breakfast blood glucose range is generally 130-140 mg/dl. His overall blood glucose range is 130-140 mg/dl. [name] presents with improved glycemic profile averaging 137 mg per DL.  Point-of-care A1c 6.9%, improving from 7.7%.  He did not document any hypoglycemia.  He is waiting for clearance to hip surgery.  He is advised to lose 10 more pounds to achieve this clearance.   ) An ACE inhibitor/angiotensin II receptor blocker is being taken. Eye exam is current.  Hyperlipidemia This is a chronic problem. The current episode started more than 1 year ago. The problem is controlled. Exacerbating diseases include diabetes and obesity. Pertinent negatives include no chest pain, myalgias or shortness of breath. Current antihyperlipidemic treatment includes statins. Risk factors for coronary artery disease include diabetes mellitus, dyslipidemia, hypertension, male sex, a sedentary lifestyle and obesity.  Hypertension This is a chronic problem. The current episode started more than 1 year ago. The problem is controlled. Pertinent negatives include no chest pain, headaches, neck pain, palpitations or shortness of breath. Risk factors for coronary artery disease include dyslipidemia, diabetes mellitus, obesity, sedentary lifestyle and male gender. Past treatments include ACE inhibitors.     Review of Systems  Constitutional:  Negative for fatigue and unexpected weight change.  HENT:  Negative for dental problem, mouth sores and trouble swallowing.   Eyes:  Negative for visual disturbance.  Respiratory:  Negative for cough, choking, chest tightness, shortness of breath and wheezing.   Cardiovascular:  Negative for chest pain, palpitations and leg swelling.  Gastrointestinal:  Negative for abdominal distention, abdominal pain, constipation, diarrhea, nausea and vomiting.  Endocrine:  Negative for polydipsia, polyphagia and polyuria.  Genitourinary:  Negative for dysuria, flank pain, hematuria and urgency.  Musculoskeletal:  Negative for back pain, gait problem, myalgias and neck pain.  Skin:  Negative for pallor, rash and wound.  Neurological:  Negative for seizures, syncope, weakness, numbness and headaches.  Psychiatric/Behavioral: Negative.  Negative for confusion and dysphoric mood.     Objective:    BP 126/82   Pulse 68   Ht 6\' 2"  (1.88 m)   Wt 281 lb 12.8 oz (127.8 kg)   BMI 36.18 kg/m   Wt Readings from Last 3 Encounters:  08/03/21 281 lb 12.8 oz (127.8 kg)  03/08/21 289 lb 6.4 oz (131.3 kg)  11/29/20 286 lb (129.7 kg)      Results for orders placed or performed in visit on 08/03/21  HgB A1c  Result Value Ref Range   Hemoglobin A1C     HbA1c POC (<> result, manual entry)     HbA1c, POC (prediabetic range)     HbA1c, POC (controlled diabetic range) 6.9 0.0 - 7.0 %   Complete Blood Count (Most recent): Lab Results  Component Value Date   WBC 8.8 08/29/2018   HGB 16.6 08/29/2018   HCT 48.0 08/29/2018   MCV 88.2 08/29/2018   PLT PLATELET CLUMPS NOTED ON SMEAR, UNABLE TO ESTIMATE 08/29/2018  Chemistry (most recent): Lab Results  Component Value Date   NA 144 02/28/2021   K 4.4 02/28/2021   CL 105 02/28/2021   CO2 25 02/28/2021   BUN 17 02/28/2021   CREATININE 0.88 02/28/2021   Diabetic Labs (most recent): Lab Results  Component Value Date   HGBA1C 6.9 08/03/2021   HGBA1C 7.7 (A) 03/08/2021   HGBA1C 6.7 11/29/2020   MICROALBUR 0.5 07/18/2019   MICROALBUR 1.5 02/12/2016   Lipid Panel     Component Value Date/Time   CHOL 188 07/21/2020 0822   TRIG 73 07/21/2020 0822   HDL 34 (L) 07/21/2020 0822   CHOLHDL 5.5 (H) 07/21/2020 0822   CHOLHDL 5.5 (H) 07/18/2019 0954   VLDL 10 02/12/2016 0834   LDLCALC 140 (H) 07/21/2020 0822   LDLCALC 107 (H) 07/18/2019 0954     Assessment & Plan:   1. Uncontrolled type 2 diabetes mellitus  with complication, without long-term current use of insulin (HCC)  Kaya presents with improved glycemic profile averaging 137 mg per DL.  Point-of-care A1c 6.9%, improving from 7.7%.  He did not document any hypoglycemia.  He is waiting for clearance to hip surgery.  He is advised to lose 10 more pounds to achieve this clearance.  -  he remains at a high risk for more acute and chronic complications of diabetes which include CAD, CVA, CKD, retinopathy, and neuropathy. These are all discussed in detail with the patient.   Recent labs reviewed.   - I have re-counseled the patient on diet management and weight loss  by adopting a carbohydrate restricted / protein rich  Diet.  - he acknowledges that there is a room for improvement in his food and drink choices. - Suggestion is made for him to avoid simple carbohydrates  from his diet including Cakes, Sweet Desserts, Ice Cream, Soda (diet and regular), Sweet Tea, Candies, Chips, Cookies, Store Bought Juices, Alcohol , Artificial Sweeteners,  Coffee Creamer, and "Sugar-free" Products, Lemonade. This will help patient to have more stable blood glucose profile and potentially avoid unintended weight gain.  The following Lifestyle Medicine recommendations according to American College of Lifestyle Medicine  [facility]) were discussed and and offered to patient and he  agrees to start the journey:  A. Whole Foods, Plant-Based Nutrition comprising of fruits and vegetables, plant-based proteins, whole-grain carbohydrates was discussed in detail with the patient.   A list for source of those nutrients were also provided to the patient.  Patient will use only water or unsweetened tea for hydration. B.  The need to stay away from risky substances including alcohol, smoking; obtaining 7 to 9 hours of restorative sleep, at least 150 minutes of moderate intensity exercise weekly, the importance of healthy social connections,  and stress management techniques were  discussed. C.  A full color page of  Calorie density of various food groups per pound showing examples of each food groups was provided to the patient.   - Patient is advised to stick to a routine mealtimes to eat 3 meals  a day and avoid unnecessary snacks ( to snack only to correct hypoglycemia).  -In light of the fact that he wants to achieve 10 more pounds of weight loss to get clearance for his surgery, he was approached for weekly Ozempic, however patient declined this offer.  He promises to do better on his lifestyle.  I discussed lifestyle medicine as above.    In the meantime, he is advised to lower his [place] to 65  units nightly,   associated with monitoring of blood glucose twice a day-daily before breakfast and at bedtime.   -He is advised to continue Janumet 50/1000 mg p.o. twice daily- after breakfast and after supper.   -He is encouraged to call clinic for blood glucose readings less than 70 or greater than 200 mg per DL at fasting.  - Patient specific target  for A1c; LDL, HDL, Triglycerides,  were discussed in detail.  2) BP/HTN:  -His blood pressure is controlled to target.  He is advised to continue lisinopril 30 mg p.o. daily at  breakfast.  He is advised on salt restrictions.     3) Lipids/HPL: His recent lipid panel showed uncontrolled LDL at 147.  He reports better consistency taking his simvastatin.  He is urged to resume his simvastatin and take it consistently, 40 mg p.o. nightly.  The above discussed lifestyle medicine interventions will help with dyslipidemia.   4)  Weight/Diet: His BMI is 36.18-clearly complicating his diabetes care.  He is a candidate for modest weight loss.    CDE consult in progress, exercise, and carbohydrates information provided.  5) Chronic Care/Health Maintenance:  -Patient is on ACEI/ARB and Statin medications and encouraged to continue to follow up with Ophthalmology, Podiatrist at least yearly or according to recommendations, and  advised to  stay away from smoking. I have recommended yearly flu vaccine and pneumonia vaccination at least every 5 years; moderate intensity exercise for up to 150 minutes weekly; and  sleep for at least 7 hours a day.   His recent ABI was negative for PAD on April 28, 2020.  The study will be repeated in March 2027, or sooner if needed.  I advised patient to maintain close follow up with his PCP for primary care needs.  I spent 41 minutes in the care of the patient today including review of labs from CMP, Lipids, Thyroid Function, Hematology (current and previous including abstractions from other facilities); face-to-face time discussing  his blood glucose readings/logs, discussing hypoglycemia and hyperglycemia episodes and symptoms, medications doses, his options of short and long term treatment based on the latest standards of care / guidelines;  discussion about incorporating lifestyle medicine;  and documenting the encounter. Risk reduction counseling performed per USPSTF guidelines to reduce obesity and cardiovascular risk factors.     Please refer to Patient Instructions for Blood Glucose Monitoring and Insulin/Medications Dosing Guide"  in media tab for additional information. Please  also refer to " Patient Self Inventory" in the Media  tab for reviewed elements of pertinent patient history.  Preston Mack participated in the discussions, expressed understanding, and voiced agreement with the above plans.  All questions were answered to his satisfaction. he is encouraged to contact clinic should he have any questions or concerns prior to his return visit.   Follow up plan: Return in about 3 months (around 11/03/2021) for F/U with Pre-visit Labs, Meter/CGM/Logs, A1c here.  Marquis Lunch, MD Phone: [phone]  Fax: [phone]  This note was partially dictated with voice recognition software. Similar sounding words can be transcribed inadequately or may not  be corrected upon  review.  08/03/2021, 5:53 PM

## 2021-08-04 ENCOUNTER — Other Ambulatory Visit: Payer: Self-pay | Admitting: "Endocrinology

## 2021-09-01 ENCOUNTER — Other Ambulatory Visit: Payer: Self-pay | Admitting: "Endocrinology

## 2021-09-19 ENCOUNTER — Telehealth: Payer: Self-pay | Admitting: "Endocrinology

## 2021-09-19 DIAGNOSIS — E119 Type 2 diabetes mellitus without complications: Secondary | ICD-10-CM

## 2021-09-19 MED ORDER — JANUMET 50-1000 MG PO TABS: ORAL_TABLET | ORAL | 0 refills | Status: DC

## 2021-09-19 NOTE — Telephone Encounter (Signed)
Rx sent 

## 2021-09-19 NOTE — Telephone Encounter (Signed)
New message    1. Which medications need to be refilled? (please list name of each medication and dose if known) JANUMET 50-1000 MG tablet  2. Which pharmacy/location (including street and city if local pharmacy) is medication to be sent to?[facility] in [place]   3. Do they need a 30 day or 90 day supply? 90 day supply

## 2021-10-07 HISTORY — PX: TOTAL HIP ARTHROPLASTY: SHX124

## 2021-11-03 ENCOUNTER — Ambulatory Visit: Payer: BC Managed Care – PPO | Admitting: "Endocrinology

## 2021-11-19 LAB — COMPREHENSIVE METABOLIC PANEL
ALT: 10 IU/L (ref 0–44)
AST: 13 IU/L (ref 0–40)
Albumin/Globulin Ratio: 2.2 (ref 1.2–2.2)
Albumin: 4.2 g/dL (ref 3.8–4.9)
Alkaline Phosphatase: 50 IU/L (ref 44–121)
BUN/Creatinine Ratio: 22 — ABNORMAL HIGH (ref 9–20)
BUN: 16 mg/dL (ref 6–24)
Bilirubin Total: 0.3 mg/dL (ref 0.0–1.2)
CO2: 24 mmol/L (ref 20–29)
Calcium: 9.4 mg/dL (ref 8.7–10.2)
Chloride: 105 mmol/L (ref 96–106)
Creatinine, Ser: 0.72 mg/dL — ABNORMAL LOW (ref 0.76–1.27)
Globulin, Total: 1.9 g/dL (ref 1.5–4.5)
Glucose: 154 mg/dL — ABNORMAL HIGH (ref 70–99)
Potassium: 4.8 mmol/L (ref 3.5–5.2)
Sodium: 143 mmol/L (ref 134–144)
Total Protein: 6.1 g/dL (ref 6.0–8.5)
eGFR: 109 mL/min/{1.73_m2} (ref >59)

## 2021-11-19 LAB — CBC WITH DIFFERENTIAL/PLATELET
Basophils Absolute: 0.1 10*3/uL (ref 0.0–0.2)
Basos: 1 %
EOS (ABSOLUTE): 0.3 10*3/uL (ref 0.0–0.4)
Eos: 5 %
Hematocrit: 42.4 % (ref 37.5–51.0)
Hemoglobin: 14.1 g/dL (ref 13.0–17.7)
Immature Grans (Abs): 0 10*3/uL (ref 0.0–0.1)
Immature Granulocytes: 1 %
Lymphocytes Absolute: 1.7 10*3/uL (ref 0.7–3.1)
Lymphs: 29 %
MCH: 30.3 pg (ref 26.6–33.0)
MCHC: 33.3 g/dL (ref 31.5–35.7)
MCV: 91 fL (ref 79–97)
Monocytes Absolute: 0.4 10*3/uL (ref 0.1–0.9)
Monocytes: 7 %
Neutrophils Absolute: 3.4 10*3/uL (ref 1.4–7.0)
Neutrophils: 57 %
Platelets: 336 10*3/uL (ref 150–450)
RBC: 4.66 x10E6/uL (ref 4.14–5.80)
RDW: 12.8 % (ref 11.6–15.4)
WBC: 5.9 10*3/uL (ref 3.4–10.8)

## 2021-11-19 LAB — LIPID PANEL
Chol/HDL Ratio: 3.6 ratio (ref 0.0–5.0)
Cholesterol, Total: 126 mg/dL (ref 100–199)
HDL: 35 mg/dL — ABNORMAL LOW (ref >39)
LDL Chol Calc (NIH): 73 mg/dL (ref 0–99)
Triglycerides: 95 mg/dL (ref 0–149)
VLDL Cholesterol Cal: 18 mg/dL (ref 5–40)

## 2021-11-19 LAB — TSH: TSH: 1.53 u[IU]/mL (ref 0.450–4.500)

## 2021-11-19 LAB — T4, FREE: Free T4: 1.17 ng/dL (ref 0.82–1.77)

## 2021-11-28 ENCOUNTER — Ambulatory Visit: Payer: BC Managed Care – PPO | Admitting: "Endocrinology

## 2021-11-28 ENCOUNTER — Encounter: Payer: Self-pay | Admitting: "Endocrinology

## 2021-11-28 VITALS — BP 132/82 | HR 68 | Ht 74.0 in | Wt 282.4 lb

## 2021-11-28 DIAGNOSIS — E782 Mixed hyperlipidemia: Secondary | ICD-10-CM | POA: Diagnosis not present

## 2021-11-28 DIAGNOSIS — I10 Essential (primary) hypertension: Secondary | ICD-10-CM | POA: Diagnosis not present

## 2021-11-28 DIAGNOSIS — E119 Type 2 diabetes mellitus without complications: Secondary | ICD-10-CM

## 2021-11-28 LAB — POCT GLYCOSYLATED HEMOGLOBIN (HGB A1C): HbA1c, POC (controlled diabetic range): 6.7 % (ref 0.0–7.0)

## 2021-11-28 MED ORDER — SIMVASTATIN 40 MG PO TABS: 40.0000 mg | ORAL_TABLET | Freq: Every evening | ORAL | 1 refills | Status: AC

## 2021-11-28 MED ORDER — TRESIBA FLEXTOUCH 200 UNIT/ML ~~LOC~~ SOPN
70.0000 [IU] | PEN_INJECTOR | Freq: Every day | SUBCUTANEOUS | 1 refills | Status: DC
Start: 2021-11-28 — End: 2021-12-08

## 2021-11-28 MED ORDER — LISINOPRIL 30 MG PO TABS: 30.0000 mg | ORAL_TABLET | Freq: Every day | ORAL | 1 refills | Status: AC

## 2021-11-28 MED ORDER — JANUMET 50-1000 MG PO TABS: ORAL_TABLET | ORAL | 1 refills | Status: DC

## 2021-11-28 NOTE — Patient Instructions (Signed)

## 2021-11-28 NOTE — Progress Notes (Signed)
11/28/2021  Endocrinology follow-up note   Subjective:    Patient ID: Preston Mack, male    DOB: 1965-10-30,    Past Medical History:  Diagnosis Date   Diabetes mellitus, type II ([place])    Hyperlipidemia    Sleep apnea    no cpap in ten years   Traumatic rotator cuff tear, right, initial encounter 07/24/2018   Past Surgical History:  Procedure Laterality Date   BICEPT TENODESIS Right 10/02/2018   Procedure: BICEPS TENODESIS;  Surgeon: Elsie Saas, MD;  Location: [place];  Service: Orthopedics;  Laterality: Right;   Lipoma removal     Osteophyte of bone     SHOULDER ARTHROSCOPY WITH SUBACROMIAL DECOMPRESSION, ROTATOR CUFF REPAIR AND BICEP TENDON REPAIR Right 10/02/2018   Procedure: RIGHT SHOULDER ARTHROSCOPY DEBRIDEMENT,  WITH SUBACROMIAL DECOMPRESSION,  DISTAL CLAVICAL EXCISION, ROTATOR CUFF REPAIR AND  BICEP TENODESIS;  Surgeon: Elsie Saas, MD;  Location: [place];  Service: Orthopedics;  Laterality: Right;  PRE/POST OP SCALENE   TOTAL HIP ARTHROPLASTY Left 10/2021   Social History   Socioeconomic History   Marital status: Married    Spouse name: Not on file   Number of children: Not on file   Years of education: Not on file   Highest education level: Not on file  Occupational History   Not on file  Tobacco Use   Smoking status: Former   Smokeless tobacco: Never  Vaping Use   Vaping Use: Never used  Substance and Sexual Activity   Alcohol use: Yes    Alcohol/week: 0.0 standard drinks of alcohol    Comment: occas   Drug use: No   Sexual activity: Not on file  Other Topics Concern   Not on file  Social History Narrative   Not on file   Social Determinants of Health   Financial Resource Strain: Not on file  Food Insecurity: Not on file  Transportation Needs: Not on file  Physical Activity: Not on file  Stress: Not on file  Social Connections: Not on file   Outpatient Encounter Medications as of 11/28/2021   Medication Sig   B-D ULTRAFINE III SHORT PEN 31G X 8 MM MISC USE AS DIRECTED   escitalopram (LEXAPRO) 10 MG tablet Take 10 mg by mouth daily. (Patient not taking: Reported on 11/28/2021)   glucose blood (TRUE METRIX BLOOD GLUCOSE TEST) test strip CHECK BLOOD SUGAR TWICE DAILY.   insulin degludec (TRESIBA FLEXTOUCH) 200 UNIT/ML FlexTouch Pen Inject 70 Units into the skin at bedtime.   lisinopril (ZESTRIL) 30 MG tablet Take 1 tablet (30 mg total) by mouth daily.   simvastatin (ZOCOR) 40 MG tablet Take 1 tablet (40 mg total) by mouth every evening.   sitaGLIPtin-metformin (JANUMET) 50-1000 MG tablet TAKE (1) TABLET TWICE A DAY WITH FOOD---BREAKFAST AND SUPPER.   [DISCONTINUED] insulin degludec (TRESIBA FLEXTOUCH) 200 UNIT/ML FlexTouch Pen Inject 70 Units into the skin at bedtime.   [DISCONTINUED] lisinopril (ZESTRIL) 30 MG tablet Take 1 tablet (30 mg total) by mouth daily.   [DISCONTINUED] simvastatin (ZOCOR) 40 MG tablet Take 1 tablet (40 mg total) by mouth every evening.   [DISCONTINUED] sitaGLIPtin-metformin (JANUMET) 50-1000 MG tablet TAKE (1) TABLET TWICE A DAY WITH FOOD---BREAKFAST AND SUPPER.   No facility-administered encounter medications on file as of 11/28/2021.   ALLERGIES: No Known Allergies VACCINATION STATUS:  There is no immunization history on file for this patient.  Diabetes He presents for his follow-up diabetic visit. He has type 2 diabetes mellitus.  Onset time: He was diagnosed at approximate age of 81 years. His disease course has been stable. There are no hypoglycemic associated symptoms. Pertinent negatives for hypoglycemia include no confusion, headaches, pallor or seizures. Pertinent negatives for diabetes include no chest pain, no fatigue, no polydipsia, no polyphagia, no polyuria and no weakness. There are no hypoglycemic complications. Symptoms are stable. There are no diabetic complications. Risk factors for coronary artery disease include diabetes mellitus,  dyslipidemia, hypertension, family history, obesity, sedentary lifestyle, tobacco exposure and male sex. Current diabetic treatment includes oral agent (dual therapy) (He is currently on Tresiba 40 units, Janumet 50/1000 mg p.o. twice daily.). He is compliant with treatment most of the time. His weight is fluctuating minimally. He is following a generally unhealthy diet. When asked about meal planning, he reported none. He has had a previous visit with a dietitian. He participates in exercise intermittently. His home blood glucose trend is fluctuating minimally. His breakfast blood glucose range is generally 130-140 mg/dl. His overall blood glucose range is 130-140 mg/dl. Preston Mack presents with improved glycemic profile averaging 137 mg per DL.  Point-of-care A1c 6.7%, improving from 7.7%.  He did not document any hypoglycemia.  He is recovering from recent hip surgery.   ) An ACE inhibitor/angiotensin II receptor blocker is being taken. Eye exam is current.  Hyperlipidemia This is a chronic problem. The current episode started more than 1 year ago. The problem is controlled. Exacerbating diseases include diabetes and obesity. Pertinent negatives include no chest pain, myalgias or shortness of breath. Current antihyperlipidemic treatment includes statins. Risk factors for coronary artery disease include diabetes mellitus, dyslipidemia, hypertension, male sex, a sedentary lifestyle and obesity.  Hypertension This is a chronic problem. The current episode started more than 1 year ago. The problem is controlled. Pertinent negatives include no chest pain, headaches, neck pain, palpitations or shortness of breath. Risk factors for coronary artery disease include dyslipidemia, diabetes mellitus, obesity, sedentary lifestyle and male gender. Past treatments include ACE inhibitors.     Review of Systems  Constitutional:  Negative for fatigue and unexpected weight change.  HENT:  Negative for dental problem,  mouth sores and trouble swallowing.   Eyes:  Negative for visual disturbance.  Respiratory:  Negative for cough, choking, chest tightness, shortness of breath and wheezing.   Cardiovascular:  Negative for chest pain, palpitations and leg swelling.  Gastrointestinal:  Negative for abdominal distention, abdominal pain, constipation, diarrhea, nausea and vomiting.  Endocrine: Negative for polydipsia, polyphagia and polyuria.  Genitourinary:  Negative for dysuria, flank pain, hematuria and urgency.  Musculoskeletal:  Negative for back pain, gait problem, myalgias and neck pain.  Skin:  Negative for pallor, rash and wound.  Neurological:  Negative for seizures, syncope, weakness, numbness and headaches.  Psychiatric/Behavioral: Negative.  Negative for confusion and dysphoric mood.     Objective:    BP 132/82   Pulse 68   Ht 6\' 2"  (1.88 m)   Wt 282 lb 6.4 oz (128.1 kg)   BMI 36.26 kg/m   Wt Readings from Last 3 Encounters:  11/28/21 282 lb 6.4 oz (128.1 kg)  08/03/21 281 lb 12.8 oz (127.8 kg)  03/08/21 289 lb 6.4 oz (131.3 kg)      Results for orders placed or performed in visit on 11/28/21  HgB A1c  Result Value Ref Range   Hemoglobin A1C     HbA1c POC (<> result, manual entry)     HbA1c, POC (prediabetic range)     HbA1c, POC (controlled  diabetic range) 6.7 0.0 - 7.0 %   Complete Blood Count (Most recent): Lab Results  Component Value Date   WBC 5.9 11/18/2021   HGB 14.1 11/18/2021   HCT 42.4 11/18/2021   MCV 91 11/18/2021   PLT 336 11/18/2021   Chemistry (most recent): Lab Results  Component Value Date   NA 143 11/18/2021   K 4.8 11/18/2021   CL 105 11/18/2021   CO2 24 11/18/2021   BUN 16 11/18/2021   CREATININE 0.72 (L) 11/18/2021   Diabetic Labs (most recent): Lab Results  Component Value Date   HGBA1C 6.7 11/28/2021   HGBA1C 6.9 08/03/2021   HGBA1C 7.7 (A) 03/08/2021   MICROALBUR 0.5 07/18/2019   MICROALBUR 1.5 02/12/2016   Lipid Panel     Component  Value Date/Time   CHOL 126 11/18/2021 0856   TRIG 95 11/18/2021 0856   HDL 35 (L) 11/18/2021 0856   CHOLHDL 3.6 11/18/2021 0856   CHOLHDL 5.5 (H) 07/18/2019 0954   VLDL 10 02/12/2016 0834   LDLCALC 73 11/18/2021 0856   LDLCALC 107 (H) 07/18/2019 0954     Assessment & Plan:   1. Uncontrolled type 2 diabetes mellitus with complication, without long-term current use of insulin ([place])  [place] presents with improved glycemic profile averaging 137 mg per DL.  Point-of-care A1c 6.7%, improving from 7.7%.  He did not document any hypoglycemia.  He is recovering from recent hip surgery.   -  he remains at a high risk for more acute and chronic complications of diabetes which include CAD, CVA, CKD, retinopathy, and neuropathy. These are all discussed in detail with the patient.   Recent labs reviewed.   - I have re-counseled the patient on diet management and weight loss  by adopting a carbohydrate restricted / protein rich  Diet.   - he acknowledges that there is a room for improvement in his food and drink choices. - Suggestion is made for him to avoid simple carbohydrates  from his diet including Cakes, Sweet Desserts, Ice Cream, Soda (diet and regular), Sweet Tea, Candies, Chips, Cookies, Store Bought Juices, Alcohol , Artificial Sweeteners,  Coffee Creamer, and "Sugar-free" Products, Lemonade. This will help patient to have more stable blood glucose profile and potentially avoid unintended weight gain.  The following Lifestyle Medicine recommendations according to [place]  [facility]) were discussed and and offered to patient and he  agrees to start the journey:  A. Whole Foods, Plant-Based Nutrition comprising of fruits and vegetables, plant-based proteins, whole-grain carbohydrates was discussed in detail with the patient.   A list for source of those nutrients were also provided to the patient.  Patient will use only water or unsweetened tea for hydration. B.   The need to stay away from risky substances including alcohol, smoking; obtaining 7 to 9 hours of restorative sleep, at least 150 minutes of moderate intensity exercise weekly, the importance of healthy social connections,  and stress management techniques were discussed. C.  A full color page of  Calorie density of various food groups per pound showing examples of each food groups was provided to the patient.    - Patient is advised to stick to a routine mealtimes to eat 3 meals  a day and avoid unnecessary snacks ( to snack only to correct hypoglycemia).  - He  is advised to continue  Tresiba 70 units nightly,   associated with monitoring of blood glucose twice a day-daily before breakfast and at bedtime.   -  He is advised to continue Janumet 50/1000 mg p.o. twice daily- after breakfast and after supper.   -He is encouraged to call clinic for blood glucose readings less than 70 or greater than 200 mg per DL at fasting.  - Patient specific target  for A1c; LDL, HDL, Triglycerides,  were discussed in detail.  2) BP/HTN:  -His blood pressure is controlled to target. He is advised to continue lisinopril 30 mg p.o. daily at  breakfast.  He is advised on salt restrictions.     3) Lipids/HPL: His recent lipid panel showed uncontrolled LDL at 147.  He reports better consistency taking his simvastatin.  He is urged to resume his simvastatin and take it consistently, 40 mg p.o. nightly.  The above discussed lifestyle medicine interventions will help with dyslipidemia.  4)  Weight/Diet: His BMI is 02.58-NIDPOEU complicating his diabetes care.  He is a candidate for modest weight loss.    CDE consult in progress, exercise, and carbohydrates information provided.  5) Chronic Care/Health Maintenance:  -Patient is on ACEI/ARB and Statin medications and encouraged to continue to follow up with Ophthalmology, Podiatrist at least yearly or according to recommendations, and advised to  stay away from smoking.  I have recommended yearly flu vaccine and pneumonia vaccination at least every 5 years; moderate intensity exercise for up to 150 minutes weekly; and  sleep for at least 7 hours a day.   His recent ABI was negative for PAD on April 28, 2020.  The study will be repeated in March 2027, or sooner if needed.  I advised patient to maintain close follow up with his PCP for primary care needs.   I spent 35 minutes in the care of the patient today including review of labs from [place], Lipids, Thyroid Function, Hematology (current and previous including abstractions from other facilities); face-to-face time discussing  his blood glucose readings/logs, discussing hypoglycemia and hyperglycemia episodes and symptoms, medications doses, his options of short and long term treatment based on the latest standards of care / guidelines;  discussion about incorporating lifestyle medicine;  and documenting the encounter. Risk reduction counseling performed per USPSTF guidelines to reduce obesity and cardiovascular risk factors.     Please refer to Patient Instructions for Blood Glucose Monitoring and Insulin/Medications Dosing Guide"  in media tab for additional information. Please  also refer to " Patient Self Inventory" in the Media  tab for reviewed elements of pertinent patient history.  Preston Mack participated in the discussions, expressed understanding, and voiced agreement with the above plans.  All questions were answered to his satisfaction. he is encouraged to contact clinic should he have any questions or concerns prior to his return visit.   Follow up plan: Return in about 4 months (around 03/31/2022) for Bring Meter/CGM Device/Logs- A1c in Office.  Glade Lloyd, MD Phone: [phone]  Fax: [phone]  This note was partially dictated with voice recognition software. Similar sounding words can be transcribed inadequately or may not  be corrected upon review.  11/28/2021, 7:02 PM

## 2021-12-08 ENCOUNTER — Other Ambulatory Visit: Payer: Self-pay

## 2021-12-08 MED ORDER — TRUE METRIX BLOOD GLUCOSE TEST VI STRP: ORAL_STRIP | 2 refills | Status: DC

## 2021-12-08 MED ORDER — TRESIBA FLEXTOUCH 200 UNIT/ML ~~LOC~~ SOPN: 70.0000 [IU] | PEN_INJECTOR | Freq: Every day | SUBCUTANEOUS | 1 refills | Status: DC

## 2021-12-09 ENCOUNTER — Other Ambulatory Visit: Payer: Self-pay

## 2021-12-09 DIAGNOSIS — E119 Type 2 diabetes mellitus without complications: Secondary | ICD-10-CM

## 2021-12-09 MED ORDER — JANUMET 50-1000 MG PO TABS: ORAL_TABLET | ORAL | 0 refills | Status: DC

## 2021-12-09 MED ORDER — BD PEN NEEDLE SHORT U/F 31G X 8 MM MISC: 1.0000 | Freq: Every day | 1 refills | Status: DC

## 2022-03-15 ENCOUNTER — Other Ambulatory Visit: Payer: Self-pay

## 2022-03-15 DIAGNOSIS — E119 Type 2 diabetes mellitus without complications: Secondary | ICD-10-CM

## 2022-03-15 MED ORDER — JANUMET 50-1000 MG PO TABS: ORAL_TABLET | ORAL | 0 refills | Status: DC

## 2022-03-31 ENCOUNTER — Ambulatory Visit: Payer: BC Managed Care – PPO | Admitting: "Endocrinology

## 2022-03-31 ENCOUNTER — Encounter: Payer: Self-pay | Admitting: "Endocrinology

## 2022-03-31 VITALS — BP 118/78 | HR 72 | Ht 74.0 in | Wt 284.2 lb

## 2022-03-31 DIAGNOSIS — E119 Type 2 diabetes mellitus without complications: Secondary | ICD-10-CM

## 2022-03-31 DIAGNOSIS — Z6836 Body mass index (BMI) 36.0-36.9, adult: Secondary | ICD-10-CM

## 2022-03-31 DIAGNOSIS — I10 Essential (primary) hypertension: Secondary | ICD-10-CM | POA: Diagnosis not present

## 2022-03-31 DIAGNOSIS — E782 Mixed hyperlipidemia: Secondary | ICD-10-CM | POA: Diagnosis not present

## 2022-03-31 LAB — POCT GLYCOSYLATED HEMOGLOBIN (HGB A1C): HbA1c, POC (controlled diabetic range): 8.9 % — AB (ref 0.0–7.0)

## 2022-03-31 MED ORDER — TRESIBA FLEXTOUCH 200 UNIT/ML ~~LOC~~ SOPN: 80.0000 [IU] | PEN_INJECTOR | Freq: Every day | SUBCUTANEOUS | 1 refills | Status: DC

## 2022-03-31 MED ORDER — EMPAGLIFLOZIN 10 MG PO TABS: 10.0000 mg | ORAL_TABLET | Freq: Every day | ORAL | 1 refills | Status: DC

## 2022-03-31 NOTE — Patient Instructions (Signed)

## 2022-03-31 NOTE — Progress Notes (Signed)
03/31/2022  Endocrinology follow-up note   Subjective:    Patient ID: Preston Mack, male    DOB: [birth year],    Past Medical History:  Diagnosis Date   Diabetes mellitus, type II ([place])    Hyperlipidemia    Sleep apnea    no cpap in ten years   Traumatic rotator cuff tear, right, initial encounter 07/24/2018   Past Surgical History:  Procedure Laterality Date   BICEPT TENODESIS Right 10/02/2018   Procedure: BICEPS TENODESIS;  Surgeon: Elsie Saas, MD;  Location: [place];  Service: Orthopedics;  Laterality: Right;   Lipoma removal     Osteophyte of bone     SHOULDER ARTHROSCOPY WITH SUBACROMIAL DECOMPRESSION, ROTATOR CUFF REPAIR AND BICEP TENDON REPAIR Right 10/02/2018   Procedure: RIGHT SHOULDER ARTHROSCOPY DEBRIDEMENT,  WITH SUBACROMIAL DECOMPRESSION,  DISTAL CLAVICAL EXCISION, ROTATOR CUFF REPAIR AND  BICEP TENODESIS;  Surgeon: Elsie Saas, MD;  Location: [place];  Service: Orthopedics;  Laterality: Right;  PRE/POST OP SCALENE   TOTAL HIP ARTHROPLASTY Left 10/2021   Social History   Socioeconomic History   Marital status: Married    Spouse name: Not on file   Number of children: Not on file   Years of education: Not on file   Highest education level: Not on file  Occupational History   Not on file  Tobacco Use   Smoking status: Former   Smokeless tobacco: Never  Vaping Use   Vaping Use: Never used  Substance and Sexual Activity   Alcohol use: Yes    Alcohol/week: 0.0 standard drinks of alcohol    Comment: occas   Drug use: No   Sexual activity: Not on file  Other Topics Concern   Not on file  Social History Narrative   Not on file   Social Determinants of Health   Financial Resource Strain: Not on file  Food Insecurity: Not on file  Transportation Needs: Not on file  Physical Activity: Not on file  Stress: Not on file  Social Connections: Not on file   Outpatient Encounter Medications as of 03/31/2022   Medication Sig   empagliflozin (JARDIANCE) 10 MG TABS tablet Take 1 tablet (10 mg total) by mouth daily before breakfast.   escitalopram (LEXAPRO) 10 MG tablet Take 10 mg by mouth daily. (Patient not taking: Reported on 11/28/2021)   glucose blood (TRUE METRIX BLOOD GLUCOSE TEST) test strip CHECK BLOOD SUGAR TWICE DAILY.   insulin degludec (TRESIBA FLEXTOUCH) 200 UNIT/ML FlexTouch Pen Inject 80 Units into the skin at bedtime.   Insulin Pen Needle (B-D ULTRAFINE III SHORT PEN) 31G X 8 MM MISC 1 each by Other route at bedtime.   lisinopril (ZESTRIL) 30 MG tablet Take 1 tablet (30 mg total) by mouth daily.   simvastatin (ZOCOR) 40 MG tablet Take 1 tablet (40 mg total) by mouth every evening.   sitaGLIPtin-metformin (JANUMET) 50-1000 MG tablet TAKE (1) TABLET TWICE A DAY WITH FOOD---BREAKFAST AND SUPPER.   [DISCONTINUED] insulin degludec (TRESIBA FLEXTOUCH) 200 UNIT/ML FlexTouch Pen Inject 70 Units into the skin at bedtime.   No facility-administered encounter medications on file as of 03/31/2022.   ALLERGIES: No Known Allergies VACCINATION STATUS:  There is no immunization history on file for this patient.  Diabetes He presents for his follow-up diabetic visit. He has type 2 diabetes mellitus. Onset time: He was diagnosed at approximate age of 75 years. His disease course has been worsening. There are no hypoglycemic associated symptoms. Pertinent negatives for hypoglycemia  include no confusion, headaches, pallor or seizures. Associated symptoms include polydipsia and polyuria. Pertinent negatives for diabetes include no chest pain, no fatigue, no polyphagia and no weakness. There are no hypoglycemic complications. Symptoms are stable. There are no diabetic complications. Risk factors for coronary artery disease include diabetes mellitus, dyslipidemia, hypertension, family history, obesity, sedentary lifestyle, tobacco exposure and male sex. Current diabetic treatment includes oral agent (dual  therapy) (He is currently on Tresiba 40 units, Janumet 50/1000 mg p.o. twice daily.). He is compliant with treatment most of the time. His weight is increasing steadily. He is following a generally unhealthy diet. When asked about meal planning, he reported none. He has had a previous visit with a dietitian. He participates in exercise intermittently. His home blood glucose trend is increasing steadily. His breakfast blood glucose range is generally >200 mg/dl. His bedtime blood glucose range is generally >200 mg/dl. His overall blood glucose range is >200 mg/dl. [name] presents with worsening glycemic profile averaging 222 for the last 7 days, point-of-care A1c of 8.9% increasing from 6.7%.  He did not have any hypoglycemia.  He reports interval exposure to steroids to treat arthralgias.) An ACE inhibitor/angiotensin II receptor blocker is being taken. Eye exam is current.  Hyperlipidemia This is a chronic problem. The current episode started more than 1 year ago. The problem is controlled. Exacerbating diseases include diabetes and obesity. Pertinent negatives include no chest pain, myalgias or shortness of breath. Current antihyperlipidemic treatment includes statins. Risk factors for coronary artery disease include diabetes mellitus, dyslipidemia, hypertension, male sex, a sedentary lifestyle and obesity.  Hypertension This is a chronic problem. The current episode started more than 1 year ago. The problem is controlled. Pertinent negatives include no chest pain, headaches, neck pain, palpitations or shortness of breath. Risk factors for coronary artery disease include dyslipidemia, diabetes mellitus, obesity, sedentary lifestyle and male gender. Past treatments include ACE inhibitors.     Review of Systems  Constitutional:  Negative for fatigue and unexpected weight change.  HENT:  Negative for dental problem, mouth sores and trouble swallowing.   Eyes:  Negative for visual disturbance.   Respiratory:  Negative for cough, choking, chest tightness, shortness of breath and wheezing.   Cardiovascular:  Negative for chest pain, palpitations and leg swelling.  Gastrointestinal:  Negative for abdominal distention, abdominal pain, constipation, diarrhea, nausea and vomiting.  Endocrine: Positive for polydipsia and polyuria. Negative for polyphagia.  Genitourinary:  Negative for dysuria, flank pain, hematuria and urgency.  Musculoskeletal:  Negative for back pain, gait problem, myalgias and neck pain.  Skin:  Negative for pallor, rash and wound.  Neurological:  Negative for seizures, syncope, weakness, numbness and headaches.  Psychiatric/Behavioral: Negative.  Negative for confusion and dysphoric mood.     Objective:    BP 118/78   Pulse 72   Ht '6\' 2"'$  (1.88 m)   Wt 284 lb 3.2 oz (128.9 kg)   BMI 36.49 kg/m   Wt Readings from Last 3 Encounters:  03/31/22 284 lb 3.2 oz (128.9 kg)  11/28/21 282 lb 6.4 oz (128.1 kg)  08/03/21 281 lb 12.8 oz (127.8 kg)      Results for orders placed or performed in visit on 03/31/22  HgB A1c  Result Value Ref Range   Hemoglobin A1C     HbA1c POC (<> result, manual entry)     HbA1c, POC (prediabetic range)     HbA1c, POC (controlled diabetic range) 8.9 (A) 0.0 - 7.0 %   Complete Blood Count (  Most recent): Lab Results  Component Value Date   WBC 5.9 11/18/2021   HGB 14.1 11/18/2021   HCT 42.4 11/18/2021   MCV 91 11/18/2021   PLT 336 11/18/2021   Chemistry (most recent): Lab Results  Component Value Date   NA 143 11/18/2021   K 4.8 11/18/2021   CL 105 11/18/2021   CO2 24 11/18/2021   BUN 16 11/18/2021   CREATININE 0.72 (L) 11/18/2021   Diabetic Labs (most recent): Lab Results  Component Value Date   HGBA1C 8.9 (A) 03/31/2022   HGBA1C 6.7 11/28/2021   HGBA1C 6.9 08/03/2021   MICROALBUR 0.5 07/18/2019   MICROALBUR 1.5 02/12/2016   Lipid Panel     Component Value Date/Time   CHOL 126 11/18/2021 0856   TRIG 95  11/18/2021 0856   HDL 35 (L) 11/18/2021 0856   CHOLHDL 3.6 11/18/2021 0856   CHOLHDL 5.5 (H) 07/18/2019 0954   VLDL 10 02/12/2016 0834   LDLCALC 73 11/18/2021 0856   LDLCALC 107 (H) 07/18/2019 0954     Assessment & Plan:   1. Uncontrolled type 2 diabetes mellitus with complication, without long-term current use of insulin ([place])  Z[place]rie presents with worsening glycemic profile averaging 222 for the last 7 days, point-of-care A1c of 8.9% increasing from 6.7%.  He did not have any hypoglycemia.  He reports interval exposure to steroids to treat arthralgias.  -  he remains at a high risk for more acute and chronic complications of diabetes which include CAD, CVA, CKD, retinopathy, and neuropathy. These are all discussed in detail with the patient.   Recent labs reviewed.   - I have re-counseled the patient on diet management and weight loss  by adopting a carbohydrate restricted / protein rich  Diet.   - he acknowledges that there is a room for improvement in his food and drink choices. - Suggestion is made for him to avoid simple carbohydrates  from his diet including Cakes, Sweet Desserts, Ice Cream, Soda (diet and regular), Sweet Tea, Candies, Chips, Cookies, Store Bought Juices, Alcohol , Artificial Sweeteners,  Coffee Creamer, and "Sugar-free" Products, Lemonade. This will help patient to have more stable blood glucose profile and potentially avoid unintended weight gain.  The following Lifestyle Medicine recommendations according to [facility]) were discussed and and offered to patient and he  agrees to start the journey:  A. Whole Foods, Plant-Based Nutrition comprising of fruits and vegetables, plant-based proteins, whole-grain carbohydrates was discussed in detail with the patient.   A list for source of those nutrients were also provided to the patient.  Patient will use only water or unsweetened tea for hydration. B.  The need to stay away from  risky substances including alcohol, smoking; obtaining 7 to 9 hours of restorative sleep, at least 150 minutes of moderate intensity exercise weekly, the importance of healthy social connections,  and stress management techniques were discussed. C.  A full color page of  Calorie density of various food groups per pound showing examples of each food groups was provided to the patient.   - Patient is advised to stick to a routine mealtimes to eat 3 meals  a day and avoid unnecessary snacks ( to snack only to correct hypoglycemia).  -He is advised to increase his [place] to 80 units nightly,    associated with monitoring of blood glucose twice a day-daily before breakfast and at bedtime.   -He is advised to continue Janumet 50/1000 mg p.o.  twice daily- after breakfast and after supper.   -He is encouraged to call clinic for blood glucose readings less than 70 or greater than 200 mg per DL at fasting. -He will benefit from additional intervention with SGLT2 inhibitors.  Has no prior exposure and denies any allergic reactions to medications.  I discussed and initiated Jardiance 10 mg p.o. daily at breakfast.  Side effects and precautions discussed with him.  If he engages with lifestyle medicine optimally, he will be considered for the prescription on subsequent visits.  - Patient specific target  for A1c; LDL, HDL, Triglycerides,  were discussed in detail.  2) BP/HTN:  -His blood pressure is controlled to target. - He is advised to continue lisinopril 30 mg p.o. daily at  breakfast.  He is advised on salt restrictions.     3) Lipids/HPL: His recent lipid panel showed improved LDL to 73 from 147.  He reports consistency with his simvastatin.  He is advised to continue simvastatin 40 mg p.o. nightly.    The above discussed lifestyle medicine interventions will help with dyslipidemia.  4)  Weight/Diet: His BMI is 0000000 complicating his diabetes care.  He is a candidate for modest weight loss.     CDE consult in progress, exercise, and carbohydrates information provided.  5) Chronic Care/Health Maintenance:  -Patient is on ACEI/ARB and Statin medications and encouraged to continue to follow up with Ophthalmology, Podiatrist at least yearly or according to recommendations, and advised to  stay away from smoking. I have recommended yearly flu vaccine and pneumonia vaccination at least every 5 years; moderate intensity exercise for up to 150 minutes weekly; and  sleep for at least 7 hours a day.   His recent ABI was negative for PAD on April 28, 2020.  The study will be repeated in March 2027, or sooner if needed.  I advised patient to maintain close follow up with his PCP for primary care needs.   I spent  26  minutes in the care of the patient today including review of labs from [place], Lipids, Thyroid Function, Hematology (current and previous including abstractions from other facilities); face-to-face time discussing  his blood glucose readings/logs, discussing hypoglycemia and hyperglycemia episodes and symptoms, medications doses, his options of short and long term treatment based on the latest standards of care / guidelines;  discussion about incorporating lifestyle medicine;  and documenting the encounter. Risk reduction counseling performed per USPSTF guidelines to reduce  obesity and cardiovascular risk factors.     Please refer to Patient Instructions for Blood Glucose Monitoring and Insulin/Medications Dosing Guide"  in media tab for additional information. Please  also refer to " Patient Self Inventory" in the Media  tab for reviewed elements of pertinent patient history.  Judithann Sauger participated in the discussions, expressed understanding, and voiced agreement with the above plans.  All questions were answered to his satisfaction. he is encouraged to contact clinic should he have any questions or concerns prior to his return visit.    Follow up plan: Return in about 3  months (around 06/29/2022) for F/U with Pre-visit Labs, Meter/CGM/Logs, A1c here.  Glade Lloyd, MD Phone: [phone]  Fax: [phone]  This note was partially dictated with voice recognition software. Similar sounding words can be transcribed inadequately or may not  be corrected upon review.  03/31/2022, 12:58 PM

## 2022-06-19 ENCOUNTER — Other Ambulatory Visit: Payer: Self-pay | Admitting: "Endocrinology

## 2022-06-19 DIAGNOSIS — E119 Type 2 diabetes mellitus without complications: Secondary | ICD-10-CM

## 2022-06-22 ENCOUNTER — Other Ambulatory Visit: Payer: Self-pay | Admitting: "Endocrinology

## 2022-07-07 ENCOUNTER — Ambulatory Visit: Payer: BC Managed Care – PPO | Admitting: "Endocrinology

## 2022-08-16 ENCOUNTER — Ambulatory Visit: Payer: BC Managed Care – PPO | Admitting: "Endocrinology

## 2022-09-16 ENCOUNTER — Other Ambulatory Visit: Payer: Self-pay | Admitting: "Endocrinology

## 2022-09-18 ENCOUNTER — Other Ambulatory Visit: Payer: Self-pay | Admitting: "Endocrinology

## 2022-09-18 DIAGNOSIS — E119 Type 2 diabetes mellitus without complications: Secondary | ICD-10-CM

## 2022-09-19 ENCOUNTER — Other Ambulatory Visit: Payer: Self-pay | Admitting: "Endocrinology

## 2022-10-04 LAB — COMPREHENSIVE METABOLIC PANEL: eGFR: 104 mL/min/{1.73_m2} (ref >59)

## 2022-10-11 ENCOUNTER — Encounter: Payer: Self-pay | Admitting: "Endocrinology

## 2022-10-11 ENCOUNTER — Ambulatory Visit: Payer: BC Managed Care – PPO | Admitting: "Endocrinology

## 2022-10-11 VITALS — BP 120/78 | HR 76 | Ht 74.0 in | Wt 281.6 lb

## 2022-10-11 DIAGNOSIS — E782 Mixed hyperlipidemia: Secondary | ICD-10-CM

## 2022-10-11 DIAGNOSIS — Z6836 Body mass index (BMI) 36.0-36.9, adult: Secondary | ICD-10-CM

## 2022-10-11 DIAGNOSIS — E119 Type 2 diabetes mellitus without complications: Secondary | ICD-10-CM

## 2022-10-11 DIAGNOSIS — Z794 Long term (current) use of insulin: Secondary | ICD-10-CM

## 2022-10-11 DIAGNOSIS — I10 Essential (primary) hypertension: Secondary | ICD-10-CM | POA: Diagnosis not present

## 2022-10-11 LAB — POCT GLYCOSYLATED HEMOGLOBIN (HGB A1C): HbA1c, POC (controlled diabetic range): 6.7 % (ref 0.0–7.0)

## 2022-10-11 MED ORDER — TRESIBA FLEXTOUCH 200 UNIT/ML ~~LOC~~ SOPN: 60.0000 [IU] | PEN_INJECTOR | Freq: Every day | SUBCUTANEOUS | 0 refills | Status: DC

## 2022-10-11 MED ORDER — EMPAGLIFLOZIN 25 MG PO TABS: 25.0000 mg | ORAL_TABLET | Freq: Every day | ORAL | 1 refills | Status: DC

## 2022-10-11 NOTE — Progress Notes (Signed)
10/11/2022  Endocrinology follow-up note   Subjective:    Patient ID: Preston Mack, male    DOB: [birth year],    Past Medical History:  Diagnosis Date   Diabetes mellitus, type II (HCC)    Hyperlipidemia    Sleep apnea    no cpap in ten years   Traumatic rotator cuff tear, right, initial encounter 07/24/2018   Past Surgical History:  Procedure Laterality Date   BICEPT TENODESIS Right 10/02/2018   Procedure: BICEPS TENODESIS;  Surgeon: Preston Marvel, MD;  Location: [facility];  Service: Orthopedics;  Laterality: Right;   Lipoma removal     Osteophyte of bone     SHOULDER ARTHROSCOPY WITH SUBACROMIAL DECOMPRESSION, ROTATOR CUFF REPAIR AND BICEP TENDON REPAIR Right 10/02/2018   Procedure: RIGHT SHOULDER ARTHROSCOPY DEBRIDEMENT,  WITH SUBACROMIAL DECOMPRESSION,  DISTAL CLAVICAL EXCISION, ROTATOR CUFF REPAIR AND  BICEP TENODESIS;  Surgeon: Preston Marvel, MD;  Location: [facility];  Service: Orthopedics;  Laterality: Right;  PRE/POST OP SCALENE   TOTAL HIP ARTHROPLASTY Left 10/2021   Social History   Socioeconomic History   Marital status: Married    Spouse name: Not on file   Number of children: Not on file   Years of education: Not on file   Highest education level: Not on file  Occupational History   Not on file  Tobacco Use   Smoking status: Former   Smokeless tobacco: Never  Vaping Use   Vaping status: Never Used  Substance and Sexual Activity   Alcohol use: Yes    Alcohol/week: 0.0 standard drinks of alcohol    Comment: occas   Drug use: No   Sexual activity: Not on file  Other Topics Concern   Not on file  Social History Narrative   Not on file   Social Determinants of Health   Financial Resource Strain: Not on file  Food Insecurity: Not on file  Transportation Needs: Not on file  Physical Activity: Not on file  Stress: Not on file  Social Connections: Unknown (06/21/2021)   Received from [facility]    Social Network    Social Network: Not on file   Outpatient Encounter Medications as of 10/11/2022  Medication Sig   B-D ULTRAFINE III SHORT PEN 31G X 8 MM MISC AS DIRECTED   empagliflozin (JARDIANCE) 25 MG TABS tablet Take 1 tablet (25 mg total) by mouth daily with breakfast.   escitalopram (LEXAPRO) 10 MG tablet Take 10 mg by mouth daily. (Patient not taking: Reported on 11/28/2021)   glucose blood (TRUE METRIX BLOOD GLUCOSE TEST) test strip CHECK BLOOD SUGAR TWICE DAILY.   insulin degludec (TRESIBA FLEXTOUCH) 200 UNIT/ML FlexTouch Pen Inject 60 Units into the skin at bedtime.   lisinopril (ZESTRIL) 30 MG tablet Take 1 tablet (30 mg total) by mouth daily.   simvastatin (ZOCOR) 40 MG tablet Take 1 tablet (40 mg total) by mouth every evening.   sitaGLIPtin-metformin (JANUMET) 50-1000 MG tablet TAKE ONE TABLET BY MOUTH TWICE DAILY WITH MEALS   [DISCONTINUED] empagliflozin (JARDIANCE) 10 MG TABS tablet TAKE ONE TABLET BY MOUTH EVERY MORNING BEFORE BREAKFAST   [DISCONTINUED] insulin degludec (TRESIBA FLEXTOUCH) 200 UNIT/ML FlexTouch Pen INJECT 80 UNITS INTO THE SKIN AT BEDTIME   No facility-administered encounter medications on file as of 10/11/2022.   ALLERGIES: No Known Allergies VACCINATION STATUS:  There is no immunization history on file for this patient.  Diabetes He presents for his follow-up diabetic visit. He has type 2 diabetes  mellitus. Onset time: He was diagnosed at approximate age of 37 years. His disease course has been improving. There are no hypoglycemic associated symptoms. Pertinent negatives for hypoglycemia include no confusion, headaches, pallor or seizures. Pertinent negatives for diabetes include no chest pain, no fatigue, no polydipsia, no polyphagia, no polyuria and no weakness. There are no hypoglycemic complications. Symptoms are improving. There are no diabetic complications. Risk factors for coronary artery disease include diabetes mellitus, dyslipidemia, hypertension,  family history, obesity, sedentary lifestyle, tobacco exposure and male sex. Current diabetic treatment includes oral agent (dual therapy) (He is currently on Tresiba 40 units, Janumet 50/1000 mg p.o. twice daily.). He is compliant with treatment most of the time. His weight is decreasing steadily. He is following a generally unhealthy diet. When asked about meal planning, he reported none. He has had a previous visit with a dietitian. He participates in exercise intermittently. His home blood glucose trend is decreasing steadily. His breakfast blood glucose range is generally 110-130 mg/dl. His overall blood glucose range is 110-130 mg/dl. Preston Mack presents with improving glycemic profile averaging 115-130 mg per DL over the last 30 days.   His point-of-care A1c is 6.7% improving from 8.9%.  He did not document hypoglycemia.   ) An ACE inhibitor/angiotensin II receptor blocker is being taken. Eye exam is current.  Hyperlipidemia This is a chronic problem. The current episode started more than 1 year ago. The problem is controlled. Exacerbating diseases include diabetes and obesity. Pertinent negatives include no chest pain, myalgias or shortness of breath. Current antihyperlipidemic treatment includes statins. Risk factors for coronary artery disease include diabetes mellitus, dyslipidemia, hypertension, male sex, a sedentary lifestyle and obesity.  Hypertension This is a chronic problem. The current episode started more than 1 year ago. The problem is controlled. Pertinent negatives include no chest pain, headaches, neck pain, palpitations or shortness of breath. Risk factors for coronary artery disease include dyslipidemia, diabetes mellitus, obesity, sedentary lifestyle and male gender. Past treatments include ACE inhibitors.     Review of Systems  Constitutional:  Negative for fatigue and unexpected weight change.  HENT:  Negative for dental problem, mouth sores and trouble swallowing.   Eyes:   Negative for visual disturbance.  Respiratory:  Negative for cough, choking, chest tightness, shortness of breath and wheezing.   Cardiovascular:  Negative for chest pain, palpitations and leg swelling.  Gastrointestinal:  Negative for abdominal distention, abdominal pain, constipation, diarrhea, nausea and vomiting.  Endocrine: Negative for polydipsia, polyphagia and polyuria.  Genitourinary:  Negative for dysuria, flank pain, hematuria and urgency.  Musculoskeletal:  Negative for back pain, gait problem, myalgias and neck pain.  Skin:  Negative for pallor, rash and wound.  Neurological:  Negative for seizures, syncope, weakness, numbness and headaches.  Psychiatric/Behavioral: Negative.  Negative for confusion and dysphoric mood.     Objective:    BP 120/78   Pulse 76   Ht 6\' 2"  (1.88 m)   Wt 281 lb 9.6 oz (127.7 kg)   BMI 36.16 kg/m   Wt Readings from Last 3 Encounters:  10/11/22 281 lb 9.6 oz (127.7 kg)  03/31/22 284 lb 3.2 oz (128.9 kg)  11/28/21 282 lb 6.4 oz (128.1 kg)      Results for orders placed or performed in visit on 10/11/22  HgB A1c  Result Value Ref Range   Hemoglobin A1C     HbA1c POC (<> result, manual entry)     HbA1c, POC (prediabetic range)     HbA1c, POC (controlled  diabetic range) 6.7 0.0 - 7.0 %   Complete Blood Count (Most recent): Lab Results  Component Value Date   WBC 5.9 11/18/2021   HGB 14.1 11/18/2021   HCT 42.4 11/18/2021   MCV 91 11/18/2021   PLT 336 11/18/2021   Chemistry (most recent): Lab Results  Component Value Date   NA 145 (H) 10/03/2022   K 5.0 10/03/2022   CL 105 10/03/2022   CO2 24 10/03/2022   BUN 19 10/03/2022   CREATININE 0.82 10/03/2022   Diabetic Labs (most recent): Lab Results  Component Value Date   HGBA1C 6.7 10/11/2022   HGBA1C 8.9 (A) 03/31/2022   HGBA1C 6.7 11/28/2021   MICROALBUR 0.5 07/18/2019   MICROALBUR 1.5 02/12/2016   Lipid Panel     Component Value Date/Time   CHOL 126 11/18/2021 0856    TRIG 95 11/18/2021 0856   HDL 35 (L) 11/18/2021 0856   CHOLHDL 3.6 11/18/2021 0856   CHOLHDL 5.5 (H) 07/18/2019 0954   VLDL 10 02/12/2016 0834   LDLCALC 73 11/18/2021 0856   LDLCALC 107 (H) 07/18/2019 0954     Assessment & Plan:   1. Uncontrolled type 2 diabetes mellitus with complication, without long-term current use of insulin (HCC)  Egor presents with improving glycemic profile averaging 115-130 mg per DL over the last 30 days.  His point-of-care A1c is 6.7% improving from 8.9%.  He did not document hypoglycemia.    -  he remains at a high risk for more acute and chronic complications of diabetes which include CAD, CVA, CKD, retinopathy, and neuropathy. These are all discussed in detail with the patient.   Recent labs reviewed.   - I have re-counseled the patient on diet management and weight loss  by adopting a carbohydrate restricted / protein rich  Diet.   - he acknowledges that there is a room for improvement in his food and drink choices. - Suggestion is made for him to avoid simple carbohydrates  from his diet including Cakes, Sweet Desserts, Ice Cream, Soda (diet and regular), Sweet Tea, Candies, Chips, Cookies, Store Bought Juices, Alcohol , Artificial Sweeteners,  Coffee Creamer, and "Sugar-free" Products, Lemonade. This will help patient to have more stable blood glucose profile and potentially avoid unintended weight gain.  The following Lifestyle Medicine recommendations according to American College of Lifestyle Medicine  [facility]) were discussed and and offered to patient and he  agrees to start the journey:  A. Whole Foods, Plant-Based Nutrition comprising of fruits and vegetables, plant-based proteins, whole-grain carbohydrates was discussed in detail with the patient.   A list for source of those nutrients were also provided to the patient.  Patient will use only water or unsweetened tea for hydration. B.  The need to stay away from risky substances including  alcohol, smoking; obtaining 7 to 9 hours of restorative sleep, at least 150 minutes of moderate intensity exercise weekly, the importance of healthy social connections,  and stress management techniques were discussed. C.  A full color page of  Calorie density of various food groups per pound showing examples of each food groups was provided to the patient.    - Patient is advised to stick to a routine mealtimes to eat 3 meals  a day and avoid unnecessary snacks ( to snack only to correct hypoglycemia).  -He is tolerating his Jardiance.  I advised him to lower his Evaristo Bury to 60 units nightly associated with monitoring of blood glucose twice a day-daily before breakfast and at  bedtime.     -He is encouraged to call clinic for blood glucose readings less than 70 or greater than 200 mg per DL at fasting. -He will benefit from a higher dose of Jardiance.  I discussed and increase his Jardiance to 25 mg p.o. daily at breakfast.  Side effects and precautions discussed with him.   -He is advised to continue Janumet 50/1000 mg p.o. twice daily.  - Patient specific target  for A1c; LDL, HDL, Triglycerides,  were discussed in detail.  2) BP/HTN:  -His blood pressure is controlled to target. - He is advised to continue lisinopril 30 mg p.o. daily at  breakfast.  He is advised on salt restrictions.     3) Lipids/HPL: His recent lipid panel showed improved LDL to 73 from 147.  He reports consistency with his simvastatin.  He is advised to continue simvastatin 40 mg p.o. nightly.    The above discussed lifestyle medicine interventions will help with dyslipidemia.  4)  Weight/Diet: His BMI is 36.16--clearly complicating his diabetes care.  He is a candidate for modest weight loss.    CDE consult in progress, exercise, and carbohydrates information provided.  5) Chronic Care/Health Maintenance:  -Patient is on ACEI/ARB and Statin medications and encouraged to continue to follow up with Ophthalmology,  Podiatrist at least yearly or according to recommendations, and advised to  stay away from smoking. I have recommended yearly flu vaccine and pneumonia vaccination at least every 5 years; moderate intensity exercise for up to 150 minutes weekly; and  sleep for at least 7 hours a day.   His recent ABI was negative for PAD on April 28, 2020.  The study will be repeated in March 2027, or sooner if needed.  I advised patient to maintain close follow up with his PCP for primary care needs.   I spent  26  minutes in the care of the patient today including review of labs from CMP, Lipids, Thyroid Function, Hematology (current and previous including abstractions from other facilities); face-to-face time discussing  his blood glucose readings/logs, discussing hypoglycemia and hyperglycemia episodes and symptoms, medications doses, his options of short and long term treatment based on the latest standards of care / guidelines;  discussion about incorporating lifestyle medicine;  and documenting the encounter. Risk reduction counseling performed per USPSTF guidelines to reduce  obesity and cardiovascular risk factors.     Please refer to Patient Instructions for Blood Glucose Monitoring and Insulin/Medications Dosing Guide"  in media tab for additional information. Please  also refer to " Patient Self Inventory" in the Media  tab for reviewed elements of pertinent patient history.  Preston Mack participated in the discussions, expressed understanding, and voiced agreement with the above plans.  All questions were answered to his satisfaction. he is encouraged to contact clinic should he have any questions or concerns prior to his return visit.   Follow up plan: Return in about 6 months (around 04/10/2023) for F/U with Pre-visit Labs, Meter/CGM/Logs, A1c here.  Marquis Lunch, MD Phone: [phone]  Fax: [phone]  This note was partially dictated with voice recognition software. Similar sounding words can  be transcribed inadequately or may not  be corrected upon review.  10/11/2022, 10:24 AM

## 2022-10-11 NOTE — Patient Instructions (Signed)

## 2022-10-24 ENCOUNTER — Other Ambulatory Visit: Payer: Self-pay | Admitting: "Endocrinology

## 2022-10-24 DIAGNOSIS — E119 Type 2 diabetes mellitus without complications: Secondary | ICD-10-CM

## 2022-11-14 ENCOUNTER — Other Ambulatory Visit: Payer: Self-pay | Admitting: "Endocrinology

## 2022-12-05 ENCOUNTER — Encounter (INDEPENDENT_AMBULATORY_CARE_PROVIDER_SITE_OTHER): Payer: Self-pay | Admitting: *Deleted

## 2022-12-07 ENCOUNTER — Other Ambulatory Visit: Payer: Self-pay | Admitting: "Endocrinology

## 2022-12-07 DIAGNOSIS — E119 Type 2 diabetes mellitus without complications: Secondary | ICD-10-CM

## 2022-12-12 ENCOUNTER — Other Ambulatory Visit: Payer: Self-pay | Admitting: "Endocrinology

## 2022-12-26 ENCOUNTER — Encounter (INDEPENDENT_AMBULATORY_CARE_PROVIDER_SITE_OTHER): Payer: Self-pay | Admitting: *Deleted

## 2022-12-26 ENCOUNTER — Telehealth (INDEPENDENT_AMBULATORY_CARE_PROVIDER_SITE_OTHER): Payer: Self-pay | Admitting: Gastroenterology

## 2022-12-26 MED ORDER — PEG 3350-KCL-NA BICARB-NACL 420 G PO SOLR: 4000.0000 mL | Freq: Once | ORAL | 0 refills | Status: AC

## 2022-12-26 NOTE — Telephone Encounter (Signed)
 Ok to schedule.  Room 1/2  Thanks,  Vista Lawman, MD Gastroenterology and Hepatology [facility]

## 2022-12-26 NOTE — Telephone Encounter (Signed)
Referral completed, TCS apt letter sent to PCP

## 2022-12-26 NOTE — Addendum Note (Signed)
Addended by: Marlowe Shores on: 12/26/2022 02:29 PM   Modules accepted: Orders

## 2022-12-26 NOTE — Telephone Encounter (Signed)
Pt wife Okey Regal contacted and wanted to be scheduled on the same day if possible. Scheduled pt for 02/05/23 at 7:30 with Dr.Ahmed. Instructions mailed to pt. Prep sent to pharmacy. No pa needed per insurance.

## 2022-12-26 NOTE — Telephone Encounter (Signed)
Who is your primary care physician: Dr.Golding  Reasons for the colonoscopy: screening  Have you had a colonoscopy before?  no  Do you have family history of colon cancer? no  Previous colonoscopy with polyps removed? no  Do you have a history colorectal cancer?   no  Are you diabetic? If yes, Type 1 or Type 2?    Yes type 2  Do you have a prosthetic or mechanical heart valve? no  Do you have a pacemaker/defibrillator?   no  Have you had endocarditis/atrial fibrillation? no  Have you had joint replacement within the last 12 months?  no  Do you tend to be constipated or have to use laxatives? no  Do you have any history of drugs or alchohol?  no  Do you use supplemental oxygen?  no  Have you had a stroke or heart attack within the last 6 months? no  Do you take weight loss medication?  no  Do you take any blood-thinning medications such as: (aspirin, warfarin, Plavix, Aggrenox)  no  If yes we need the name, milligram, dosage and who is prescribing doctor  Current Outpatient Medications on File Prior to Visit  Medication Sig Dispense Refill   B-D ULTRAFINE III SHORT PEN 31G X 8 MM MISC AS DIRECTED 100 each 1   empagliflozin (JARDIANCE) 25 MG TABS tablet Take 1 tablet (25 mg total) by mouth daily with breakfast. 90 tablet 1   insulin degludec (TRESIBA FLEXTOUCH) 200 UNIT/ML FlexTouch Pen Inject 60 Units into the skin at bedtime. 24 mL 0   JANUMET 50-1000 MG tablet TAKE ONE TABLET BY MOUTH TWICE DAILY WITH MEALS 180 tablet 0   lisinopril (ZESTRIL) 30 MG tablet Take 1 tablet (30 mg total) by mouth daily. 90 tablet 1   simvastatin (ZOCOR) 40 MG tablet Take 1 tablet (40 mg total) by mouth every evening. 90 tablet 1   TRUE METRIX BLOOD GLUCOSE TEST test strip check blood sugar TWICE DAILY 100 each 2   escitalopram (LEXAPRO) 10 MG tablet Take 10 mg by mouth daily. (Patient not taking: Reported on 11/28/2021)     No current facility-administered medications on file prior to visit.     No Known Allergies   Pharmacy: [facility]  Primary Insurance Name: Ezequiel Essex  Best number where you can be reached: [phone]

## 2023-01-01 ENCOUNTER — Other Ambulatory Visit: Payer: Self-pay | Admitting: "Endocrinology

## 2023-01-01 ENCOUNTER — Telehealth (INDEPENDENT_AMBULATORY_CARE_PROVIDER_SITE_OTHER): Payer: Self-pay | Admitting: Gastroenterology

## 2023-01-01 NOTE — Telephone Encounter (Signed)
Pt called in to cancel TCS for 02/05/23 with Dr.Ahmed. pt states 25th wedding anniversary is the Saturday before and she misunderstood when to stop eating. Will call to reschedule in January. Pt would like Monday first thing in morning

## 2023-02-05 ENCOUNTER — Encounter (HOSPITAL_COMMUNITY): Payer: Self-pay

## 2023-02-05 ENCOUNTER — Ambulatory Visit (HOSPITAL_COMMUNITY): Admit: 2023-02-05 | Payer: BC Managed Care – PPO

## 2023-02-05 SURGERY — COLONOSCOPY WITH PROPOFOL
Anesthesia: Monitor Anesthesia Care

## 2023-02-15 DIAGNOSIS — Z1283 Encounter for screening for malignant neoplasm of skin: Secondary | ICD-10-CM | POA: Diagnosis not present

## 2023-02-15 DIAGNOSIS — L918 Other hypertrophic disorders of the skin: Secondary | ICD-10-CM | POA: Diagnosis not present

## 2023-02-15 DIAGNOSIS — L814 Other melanin hyperpigmentation: Secondary | ICD-10-CM | POA: Diagnosis not present

## 2023-02-15 DIAGNOSIS — D2371 Other benign neoplasm of skin of right lower limb, including hip: Secondary | ICD-10-CM | POA: Diagnosis not present

## 2023-02-15 DIAGNOSIS — L570 Actinic keratosis: Secondary | ICD-10-CM | POA: Diagnosis not present

## 2023-02-15 DIAGNOSIS — L300 Nummular dermatitis: Secondary | ICD-10-CM | POA: Diagnosis not present

## 2023-03-05 ENCOUNTER — Telehealth: Payer: Self-pay

## 2023-03-05 NOTE — Telephone Encounter (Signed)
Pt called stating his blood glucose levels have been low for the past week.  Pt BG readings.   Date Before breakfast Before lunch Before supper Bedtime  03/03/23 58   90s  03/04/23 53 pt reduced his Jardiance to 10 mg daily   83 pt reduced Tresiba to 50 units before bed.  03/05/23 68             Pt taking: Jardiance 25mg  daily but reduced it to 10mg  yesterday, Janumet 50-1000mg  bid and Tresiba 60 units at bedtime but reduced it to 50 units last night. Please advise.

## 2023-03-06 ENCOUNTER — Other Ambulatory Visit: Payer: Self-pay | Admitting: "Endocrinology

## 2023-03-06 DIAGNOSIS — E119 Type 2 diabetes mellitus without complications: Secondary | ICD-10-CM

## 2023-03-06 NOTE — Telephone Encounter (Signed)
Left a message requesting pt return call to the office.

## 2023-03-06 NOTE — Telephone Encounter (Signed)
Spoke with pt advising him to lower his Tresiba to 30 units at bedtime per Dr.Nida's orders. Pt voiced understanding.

## 2023-03-08 DIAGNOSIS — E119 Type 2 diabetes mellitus without complications: Secondary | ICD-10-CM | POA: Diagnosis not present

## 2023-04-02 DIAGNOSIS — E782 Mixed hyperlipidemia: Secondary | ICD-10-CM | POA: Diagnosis not present

## 2023-04-02 DIAGNOSIS — E119 Type 2 diabetes mellitus without complications: Secondary | ICD-10-CM | POA: Diagnosis not present

## 2023-04-03 LAB — LIPID PANEL
Chol/HDL Ratio: 3.8 ratio (ref 0.0–5.0)
Cholesterol, Total: 120 mg/dL (ref 100–199)
HDL: 32 mg/dL — ABNORMAL LOW (ref >39)
LDL Chol Calc (NIH): 69 mg/dL (ref 0–99)
Triglycerides: 97 mg/dL (ref 0–149)
VLDL Cholesterol Cal: 19 mg/dL (ref 5–40)

## 2023-04-03 LAB — COMPREHENSIVE METABOLIC PANEL
ALT: 17 IU/L (ref 0–44)
AST: 15 IU/L (ref 0–40)
Albumin: 4.4 g/dL (ref 3.8–4.9)
Alkaline Phosphatase: 56 IU/L (ref 44–121)
BUN/Creatinine Ratio: 29 — ABNORMAL HIGH (ref 9–20)
BUN: 25 mg/dL — ABNORMAL HIGH (ref 6–24)
Bilirubin Total: 0.4 mg/dL (ref 0.0–1.2)
CO2: 21 mmol/L (ref 20–29)
Calcium: 9.7 mg/dL (ref 8.7–10.2)
Chloride: 101 mmol/L (ref 96–106)
Creatinine, Ser: 0.87 mg/dL (ref 0.76–1.27)
Globulin, Total: 2.2 g/dL (ref 1.5–4.5)
Glucose: 102 mg/dL — ABNORMAL HIGH (ref 70–99)
Potassium: 5.5 mmol/L — ABNORMAL HIGH (ref 3.5–5.2)
Sodium: 142 mmol/L (ref 134–144)
Total Protein: 6.6 g/dL (ref 6.0–8.5)
eGFR: 102 mL/min/{1.73_m2} (ref >59)

## 2023-04-10 ENCOUNTER — Ambulatory Visit: Payer: BC Managed Care – PPO | Admitting: "Endocrinology

## 2023-04-10 ENCOUNTER — Encounter: Payer: Self-pay | Admitting: "Endocrinology

## 2023-04-10 VITALS — BP 132/84 | HR 60 | Ht 74.0 in | Wt 254.6 lb

## 2023-04-10 DIAGNOSIS — E66811 Obesity, class 1: Secondary | ICD-10-CM | POA: Insufficient documentation

## 2023-04-10 DIAGNOSIS — E119 Type 2 diabetes mellitus without complications: Secondary | ICD-10-CM

## 2023-04-10 DIAGNOSIS — I10 Essential (primary) hypertension: Secondary | ICD-10-CM | POA: Diagnosis not present

## 2023-04-10 DIAGNOSIS — Z794 Long term (current) use of insulin: Secondary | ICD-10-CM | POA: Diagnosis not present

## 2023-04-10 DIAGNOSIS — E782 Mixed hyperlipidemia: Secondary | ICD-10-CM

## 2023-04-10 DIAGNOSIS — E6609 Other obesity due to excess calories: Secondary | ICD-10-CM | POA: Insufficient documentation

## 2023-04-10 DIAGNOSIS — Z6832 Body mass index (BMI) 32.0-32.9, adult: Secondary | ICD-10-CM

## 2023-04-10 LAB — POCT GLYCOSYLATED HEMOGLOBIN (HGB A1C): HbA1c, POC (controlled diabetic range): 6.1 % (ref 0.0–7.0)

## 2023-04-10 MED ORDER — TRESIBA FLEXTOUCH 200 UNIT/ML ~~LOC~~ SOPN: 14.0000 [IU] | PEN_INJECTOR | Freq: Every evening | SUBCUTANEOUS | 0 refills | Status: DC

## 2023-04-10 MED ORDER — TRUE METRIX BLOOD GLUCOSE TEST VI STRP: ORAL_STRIP | 2 refills | Status: DC

## 2023-04-10 NOTE — Patient Instructions (Signed)

## 2023-04-10 NOTE — Progress Notes (Signed)
 04/10/2023  Endocrinology follow-up note   Subjective:    Patient ID: Preston Mack, male    DOB: [birth year],    Past Medical History:  Diagnosis Date   Diabetes mellitus, type II (HCC)    Hyperlipidemia    Sleep apnea    no cpap in ten years   Traumatic rotator cuff tear, right, initial encounter 07/24/2018   Past Surgical History:  Procedure Laterality Date   BICEPT TENODESIS Right 10/02/2018   Procedure: BICEPS TENODESIS;  Surgeon: Salvatore Marvel, MD;  Location: [facility];  Service: Orthopedics;  Laterality: Right;   Lipoma removal     Osteophyte of bone     SHOULDER ARTHROSCOPY WITH SUBACROMIAL DECOMPRESSION, ROTATOR CUFF REPAIR AND BICEP TENDON REPAIR Right 10/02/2018   Procedure: RIGHT SHOULDER ARTHROSCOPY DEBRIDEMENT,  WITH SUBACROMIAL DECOMPRESSION,  DISTAL CLAVICAL EXCISION, ROTATOR CUFF REPAIR AND  BICEP TENODESIS;  Surgeon: Salvatore Marvel, MD;  Location: [facility];  Service: Orthopedics;  Laterality: Right;  PRE/POST OP SCALENE   TOTAL HIP ARTHROPLASTY Left 10/2021   Social History   Socioeconomic History   Marital status: Married    Spouse name: Not on file   Number of children: Not on file   Years of education: Not on file   Highest education level: Not on file  Occupational History   Not on file  Tobacco Use   Smoking status: Former   Smokeless tobacco: Never  Vaping Use   Vaping status: Never Used  Substance and Sexual Activity   Alcohol use: Yes    Alcohol/week: 0.0 standard drinks of alcohol    Comment: occas   Drug use: No   Sexual activity: Not on file  Other Topics Concern   Not on file  Social History Narrative   Not on file   Social Drivers of Health   Financial Resource Strain: Not on file  Food Insecurity: Not on file  Transportation Needs: Not on file  Physical Activity: Not on file  Stress: Not on file  Social Connections: Unknown (06/21/2021)   Received from [facility], [facility]   Social  Network    Social Network: Not on file   Outpatient Encounter Medications as of 04/10/2023  Medication Sig   B-D ULTRAFINE III SHORT PEN 31G X 8 MM MISC AS DIRECTED   escitalopram (LEXAPRO) 10 MG tablet Take 10 mg by mouth daily. (Patient not taking: Reported on 11/28/2021)   glucose blood (TRUE METRIX BLOOD GLUCOSE TEST) test strip CHECK BLOOD SUGAR TWICE DAILY.   insulin degludec (TRESIBA FLEXTOUCH) 200 UNIT/ML FlexTouch Pen Inject 14 Units into the skin at bedtime.   JANUMET 50-1000 MG tablet TAKE ONE TABLET BY MOUTH TWICE DAILY WITH MEALS   JARDIANCE 25 MG TABS tablet TAKE ONE TABLET BY MOUTH EVERY MORNING WITH BREAKFAST   lisinopril (ZESTRIL) 30 MG tablet Take 1 tablet (30 mg total) by mouth daily.   simvastatin (ZOCOR) 40 MG tablet Take 1 tablet (40 mg total) by mouth every evening.   [DISCONTINUED] TRESIBA FLEXTOUCH 200 UNIT/ML FlexTouch Pen INJECT 60 UNITS SUBCUTANEOUSLY AT BEDTIME (Patient taking differently: 30 Units at bedtime.)   [DISCONTINUED] TRUE METRIX BLOOD GLUCOSE TEST test strip check blood sugar TWICE DAILY   No facility-administered encounter medications on file as of 04/10/2023.   ALLERGIES: No Known Allergies VACCINATION STATUS:  There is no immunization history on file for this patient.  Diabetes He presents for his follow-up diabetic visit. He has type 2 diabetes mellitus. Onset time: He  was diagnosed at approximate age of 37 years. His disease course has been improving. There are no hypoglycemic associated symptoms. Pertinent negatives for hypoglycemia include no confusion, headaches, pallor or seizures. Pertinent negatives for diabetes include no chest pain, no fatigue, no polydipsia, no polyphagia, no polyuria and no weakness. There are no hypoglycemic complications. Symptoms are improving. There are no diabetic complications. Risk factors for coronary artery disease include diabetes mellitus, dyslipidemia, hypertension, family history, obesity, sedentary lifestyle,  tobacco exposure and male sex. Current diabetic treatment includes oral agent (dual therapy). He is compliant with treatment most of the time. His weight is decreasing steadily. When asked about meal planning, he reported none. He has had a previous visit with a dietitian. He participates in exercise intermittently. His home blood glucose trend is decreasing steadily. His breakfast blood glucose range is generally 90-110 mg/dl. His bedtime blood glucose range is generally 110-130 mg/dl. His overall blood glucose range is 110-130 mg/dl. Preston Mack presents with improving profile averaging 105-110 for the last 30 days.  His point-of-care A1c is 6.1%, overall improving from 8.9%.  He presents with significant weight loss, benefiting from lifestyle medicine nutrition discussed with him during his last visit.    ) An ACE inhibitor/angiotensin II receptor blocker is being taken. Eye exam is current.  Hyperlipidemia This is a chronic problem. The current episode started more than 1 year ago. The problem is controlled. Exacerbating diseases include diabetes and obesity. Pertinent negatives include no chest pain, myalgias or shortness of breath. Current antihyperlipidemic treatment includes statins. Risk factors for coronary artery disease include diabetes mellitus, dyslipidemia, hypertension, male sex, a sedentary lifestyle and obesity.  Hypertension This is a chronic problem. The current episode started more than 1 year ago. The problem is controlled. Pertinent negatives include no chest pain, headaches, neck pain, palpitations or shortness of breath. Risk factors for coronary artery disease include dyslipidemia, diabetes mellitus, obesity, sedentary lifestyle and male gender. Past treatments include ACE inhibitors.     Review of Systems  Constitutional:  Negative for fatigue and unexpected weight change.  HENT:  Negative for dental problem, mouth sores and trouble swallowing.   Eyes:  Negative for visual  disturbance.  Respiratory:  Negative for cough, choking, chest tightness, shortness of breath and wheezing.   Cardiovascular:  Negative for chest pain, palpitations and leg swelling.  Gastrointestinal:  Negative for abdominal distention, abdominal pain, constipation, diarrhea, nausea and vomiting.  Endocrine: Negative for polydipsia, polyphagia and polyuria.  Genitourinary:  Negative for dysuria, flank pain, hematuria and urgency.  Musculoskeletal:  Negative for back pain, gait problem, myalgias and neck pain.  Skin:  Negative for pallor, rash and wound.  Neurological:  Negative for seizures, syncope, weakness, numbness and headaches.  Psychiatric/Behavioral: Negative.  Negative for confusion and dysphoric mood.     Objective:    BP 132/84   Pulse 60   Ht 6\' 2"  (1.88 m)   Wt 254 lb 9.6 oz (115.5 kg)   BMI 32.69 kg/m   Wt Readings from Last 3 Encounters:  04/10/23 254 lb 9.6 oz (115.5 kg)  10/11/22 281 lb 9.6 oz (127.7 kg)  03/31/22 284 lb 3.2 oz (128.9 kg)      Results for orders placed or performed in visit on 04/10/23  HgB A1c   Collection Time: 04/10/23 10:28 AM  Result Value Ref Range   Hemoglobin A1C     HbA1c POC (<> result, manual entry)     HbA1c, POC (prediabetic range)     HbA1c,  POC (controlled diabetic range) 6.1 0.0 - 7.0 %   Complete Blood Count (Most recent): Lab Results  Component Value Date   WBC 5.9 11/18/2021   HGB 14.1 11/18/2021   HCT 42.4 11/18/2021   MCV 91 11/18/2021   PLT 336 11/18/2021   Chemistry (most recent): Lab Results  Component Value Date   NA 142 04/02/2023   K 5.5 (H) 04/02/2023   CL 101 04/02/2023   CO2 21 04/02/2023   BUN 25 (H) 04/02/2023   CREATININE 0.87 04/02/2023   Diabetic Labs (most recent): Lab Results  Component Value Date   HGBA1C 6.1 04/10/2023   HGBA1C 6.7 10/11/2022   HGBA1C 8.9 (A) 03/31/2022   MICROALBUR 0.5 07/18/2019   MICROALBUR 1.5 02/12/2016   Lipid Panel     Component Value Date/Time   CHOL  120 04/02/2023 0816   TRIG 97 04/02/2023 0816   HDL 32 (L) 04/02/2023 0816   CHOLHDL 3.8 04/02/2023 0816   CHOLHDL 5.5 (H) 07/18/2019 0954   VLDL 10 02/12/2016 0834   LDLCALC 69 04/02/2023 0816   LDLCALC 107 (H) 07/18/2019 0954     Assessment & Plan:   1. Uncontrolled type 2 diabetes mellitus with complication, without long-term current use of insulin (HCC)  Dudley presents with improving profile averaging 105-110 for the last 30 days.  His point-of-care A1c is 6.1%, overall improving from 8.9%.  He presents with significant weight loss, benefiting from lifestyle medicine nutrition discussed with him during his last visit.  He did not document hypoglycemia.  -  he remains at a high risk for more acute and chronic complications of diabetes which include CAD, CVA, CKD, retinopathy, and neuropathy. These are all discussed in detail with the patient.   Recent labs reviewed.   - I have re-counseled the patient on diet management and weight loss  by adopting a carbohydrate restricted / protein rich  Diet.   - he acknowledges that there is a room for improvement in his food and drink choices. - Suggestion is made for him to avoid simple carbohydrates  from his diet including Cakes, Sweet Desserts, Ice Cream, Soda (diet and regular), Sweet Tea, Candies, Chips, Cookies, Store Bought Juices, Alcohol , Artificial Sweeteners,  Coffee Creamer, and "Sugar-free" Products, Lemonade. This will help patient to have more stable blood glucose profile and potentially avoid unintended weight gain.  The following Lifestyle Medicine recommendations according to American College of Lifestyle Medicine  [facility]) were discussed and and offered to patient and he  agrees to start the journey:  A. Whole Foods, Plant-Based Nutrition comprising of fruits and vegetables, plant-based proteins, whole-grain carbohydrates was discussed in detail with the patient.   A list for source of those nutrients were also provided to  the patient.  Patient will use only water or unsweetened tea for hydration. B.  The need to stay away from risky substances including alcohol, smoking; obtaining 7 to 9 hours of restorative sleep, at least 150 minutes of moderate intensity exercise weekly, the importance of healthy social connections,  and stress management techniques were discussed. C.  A full color page of  Calorie density of various food groups per pound showing examples of each food groups was provided to the patient.   - Patient is advised to stick to a routine mealtimes to eat 3 meals  a day and avoid unnecessary snacks ( to snack only to correct hypoglycemia).  -He is responding to lifestyle medicine nutrition.  He is advised to lower his  Tresiba to 14 units nightly associated with monitoring of blood glucose at least twice a day-daily before breakfast and at bedtime.   -This patient does have a chance to come off of insulin if he stays engaged with lifestyle medicine.   -He is encouraged to call clinic for blood glucose readings less than 70 or greater than 200 mg per DL at fasting. -He will benefit from the SGLT2 inhibitor.  He is advised to continue Jardiance 25 mg p.o. daily at breakfast along with Janumet 50/1000 mg p.o. twice daily.    - Patient specific target  for A1c; LDL, HDL, Triglycerides,  were discussed in detail.  2) BP/HTN:  -His blood pressure is controlled to target. - He is advised to continue lisinopril 30 mg p.o. daily at  breakfast.  He is advised on salt restrictions.     3) Lipids/HPL: His recent lipid panel showed improved LDL at 69 from 147.  He will continue to benefit from simvastatin 40 mg p.o. nightly.  The above discussed lifestyle medicine interventions will help with dyslipidemia.  4)  Weight/Diet: His BMI is 32.69 dropping from 36.16-- He is a candidate for some more  weight loss.    CDE consult in progress, exercise, and carbohydrates information provided.  5) Chronic Care/Health  Maintenance:  -Patient is on ACEI/ARB and Statin medications and encouraged to continue to follow up with Ophthalmology, Podiatrist at least yearly or according to recommendations, and advised to  stay away from smoking. I have recommended yearly flu vaccine and pneumonia vaccination at least every 5 years; moderate intensity exercise for up to 150 minutes weekly; and  sleep for at least 7 hours a day.   His recent ABI was negative for PAD on April 28, 2020.  The study will be repeated in March 2027, or sooner if needed.  I advised patient to maintain close follow up with his PCP for primary care needs.   I spent  26  minutes in the care of the patient today including review of labs from CMP, Lipids, Thyroid Function, Hematology (current and previous including abstractions from other facilities); face-to-face time discussing  his blood glucose readings/logs, discussing hypoglycemia and hyperglycemia episodes and symptoms, medications doses, his options of short and long term treatment based on the latest standards of care / guidelines;  discussion about incorporating lifestyle medicine;  and documenting the encounter. Risk reduction counseling performed per USPSTF guidelines to reduce  obesity and cardiovascular risk factors.     Please refer to Patient Instructions for Blood Glucose Monitoring and Insulin/Medications Dosing Guide"  in media tab for additional information. Please  also refer to " Patient Self Inventory" in the Media  tab for reviewed elements of pertinent patient history.  Preston Mack participated in the discussions, expressed understanding, and voiced agreement with the above plans.  All questions were answered to his satisfaction. he is encouraged to contact clinic should he have any questions or concerns prior to his return visit.    Follow up plan: Return in about 4 months (around 08/10/2023) for Bring Meter/CGM Device/Logs- A1c in Office.  Marquis Lunch, MD Phone:  [phone]  Fax: [phone]  This note was partially dictated with voice recognition software. Similar sounding words can be transcribed inadequately or may not  be corrected upon review.  04/10/2023, 11:09 AM

## 2023-04-26 ENCOUNTER — Other Ambulatory Visit: Payer: Self-pay | Admitting: "Endocrinology

## 2023-07-09 DIAGNOSIS — E109 Type 1 diabetes mellitus without complications: Secondary | ICD-10-CM | POA: Diagnosis not present

## 2023-07-09 DIAGNOSIS — Z0001 Encounter for general adult medical examination with abnormal findings: Secondary | ICD-10-CM | POA: Diagnosis not present

## 2023-07-09 DIAGNOSIS — E785 Hyperlipidemia, unspecified: Secondary | ICD-10-CM | POA: Diagnosis not present

## 2023-07-09 DIAGNOSIS — R7989 Other specified abnormal findings of blood chemistry: Secondary | ICD-10-CM | POA: Diagnosis not present

## 2023-07-09 DIAGNOSIS — I10 Essential (primary) hypertension: Secondary | ICD-10-CM | POA: Diagnosis not present

## 2023-07-09 DIAGNOSIS — G4733 Obstructive sleep apnea (adult) (pediatric): Secondary | ICD-10-CM | POA: Diagnosis not present

## 2023-08-13 ENCOUNTER — Encounter: Payer: Self-pay | Admitting: "Endocrinology

## 2023-08-13 ENCOUNTER — Ambulatory Visit: Admitting: "Endocrinology

## 2023-08-13 VITALS — BP 120/76 | HR 60 | Ht 74.0 in | Wt 244.0 lb

## 2023-08-13 DIAGNOSIS — I10 Essential (primary) hypertension: Secondary | ICD-10-CM

## 2023-08-13 DIAGNOSIS — Z794 Long term (current) use of insulin: Secondary | ICD-10-CM

## 2023-08-13 DIAGNOSIS — E119 Type 2 diabetes mellitus without complications: Secondary | ICD-10-CM

## 2023-08-13 DIAGNOSIS — E782 Mixed hyperlipidemia: Secondary | ICD-10-CM

## 2023-08-13 LAB — POCT GLYCOSYLATED HEMOGLOBIN (HGB A1C): HbA1c, POC (controlled diabetic range): 6.1 % (ref 0.0–7.0)

## 2023-08-13 NOTE — Patient Instructions (Signed)

## 2023-08-13 NOTE — Progress Notes (Signed)
 08/13/2023  Endocrinology follow-up note   Subjective:    Patient ID: Preston Mack, male    DOB: 1965/04/10,    Past Medical History:  Diagnosis Date   Diabetes mellitus, type II (HCC)    Hyperlipidemia    Sleep apnea    no cpap in ten years   Traumatic rotator cuff tear, right, initial encounter 07/24/2018   Past Surgical History:  Procedure Laterality Date   BICEPT TENODESIS Right 10/02/2018   Procedure: BICEPS TENODESIS;  Surgeon: Jane Charleston, MD;  Location: [facility];  Service: Orthopedics;  Laterality: Right;   Lipoma removal     Osteophyte of bone     SHOULDER ARTHROSCOPY WITH SUBACROMIAL DECOMPRESSION, ROTATOR CUFF REPAIR AND BICEP TENDON REPAIR Right 10/02/2018   Procedure: RIGHT SHOULDER ARTHROSCOPY DEBRIDEMENT,  WITH SUBACROMIAL DECOMPRESSION,  DISTAL CLAVICAL EXCISION, ROTATOR CUFF REPAIR AND  BICEP TENODESIS;  Surgeon: Jane Charleston, MD;  Location: [facility];  Service: Orthopedics;  Laterality: Right;  PRE/POST OP SCALENE   TOTAL HIP ARTHROPLASTY Left 10/2021   Social History   Socioeconomic History   Marital status: Married    Spouse name: Not on file   Number of children: Not on file   Years of education: Not on file   Highest education level: Not on file  Occupational History   Not on file  Tobacco Use   Smoking status: Former   Smokeless tobacco: Never  Vaping Use   Vaping status: Never Used  Substance and Sexual Activity   Alcohol use: Yes    Alcohol/week: 0.0 standard drinks of alcohol    Comment: occas   Drug use: No   Sexual activity: Not on file  Other Topics Concern   Not on file  Social History Narrative   Not on file   Social Drivers of Health   Financial Resource Strain: Not on file  Food Insecurity: Not on file  Transportation Needs: Not on file  Physical Activity: Not on file  Stress: Not on file  Social Connections: Unknown (06/21/2021)   Received from [facility]   Social Network     Social Network: Not on file   Outpatient Encounter Medications as of 08/13/2023  Medication Sig   B-D ULTRAFINE III SHORT PEN 31G X 8 MM MISC AS DIRECTED   escitalopram (LEXAPRO) 10 MG tablet Take 10 mg by mouth daily. (Patient not taking: Reported on 11/28/2021)   glucose blood (TRUE METRIX BLOOD GLUCOSE TEST) test strip CHECK BLOOD SUGAR TWICE DAILY.   JANUMET  50-1000 MG tablet TAKE ONE TABLET BY MOUTH TWICE DAILY WITH MEALS   JARDIANCE  25 MG TABS tablet TAKE ONE TABLET BY MOUTH EVERY MORNING WITH BREAKFAST   lisinopril  (ZESTRIL ) 30 MG tablet Take 1 tablet (30 mg total) by mouth daily.   simvastatin  (ZOCOR ) 40 MG tablet Take 1 tablet (40 mg total) by mouth every evening.   [DISCONTINUED] insulin  degludec (TRESIBA  FLEXTOUCH) 200 UNIT/ML FlexTouch Pen Inject 14 Units into the skin at bedtime.   No facility-administered encounter medications on file as of 08/13/2023.   ALLERGIES: No Known Allergies VACCINATION STATUS:  There is no immunization history on file for this patient.  Diabetes He presents for his follow-up diabetic visit. He has type 2 diabetes mellitus. Onset time: He was diagnosed at approximate age of 37 years. His disease course has been stable. There are no hypoglycemic associated symptoms. Pertinent negatives for hypoglycemia include no confusion, headaches, pallor or seizures. Pertinent negatives for diabetes include no chest  pain, no fatigue, no polydipsia, no polyphagia, no polyuria and no weakness. There are no hypoglycemic complications. Symptoms are stable. There are no diabetic complications. Risk factors for coronary artery disease include diabetes mellitus, dyslipidemia, hypertension, family history, obesity, sedentary lifestyle, tobacco exposure and male sex. Current diabetic treatment includes oral agent (dual therapy). He is compliant with treatment most of the time. His weight is decreasing steadily. When asked about meal planning, he reported none. He has had a previous  visit with a dietitian. He participates in exercise intermittently. His home blood glucose trend is decreasing steadily. His breakfast blood glucose range is generally 90-110 mg/dl. His bedtime blood glucose range is generally 110-130 mg/dl. His overall blood glucose range is 110-130 mg/dl. Thermon presents with continued improvement in her glycemic profile to target.  His point-of-care A1c is 6.1%, overall improving.  He presents with significant weight loss benefiting from lifestyle medicine nutrition as well as Jardiance .     ) An ACE inhibitor/angiotensin II receptor blocker is being taken. Eye exam is current.  Hyperlipidemia This is a chronic problem. The current episode started more than 1 year ago. The problem is controlled. Exacerbating diseases include diabetes and obesity. Pertinent negatives include no chest pain, myalgias or shortness of breath. Current antihyperlipidemic treatment includes statins. Risk factors for coronary artery disease include diabetes mellitus, dyslipidemia, hypertension, male sex, a sedentary lifestyle and obesity.  Hypertension This is a chronic problem. The current episode started more than 1 year ago. The problem is controlled. Pertinent negatives include no chest pain, headaches, neck pain, palpitations or shortness of breath. Risk factors for coronary artery disease include dyslipidemia, diabetes mellitus, obesity, sedentary lifestyle and male gender. Past treatments include ACE inhibitors.     Objective:    BP 120/76   Pulse 60   Ht 6' 2 (1.88 m)   Wt 244 lb (110.7 kg)   BMI 31.33 kg/m   Wt Readings from Last 3 Encounters:  08/13/23 244 lb (110.7 kg)  04/10/23 254 lb 9.6 oz (115.5 kg)  10/11/22 281 lb 9.6 oz (127.7 kg)      Results for orders placed or performed in visit on 08/13/23  HgB A1c   Collection Time: 08/13/23  8:36 AM  Result Value Ref Range   Hemoglobin A1C     HbA1c POC (<> result, manual entry)     HbA1c, POC (prediabetic range)      HbA1c, POC (controlled diabetic range) 6.1 0.0 - 7.0 %   Complete Blood Count (Most recent): Lab Results  Component Value Date   WBC 5.9 11/18/2021   HGB 14.1 11/18/2021   HCT 42.4 11/18/2021   MCV 91 11/18/2021   PLT 336 11/18/2021   Chemistry (most recent): Lab Results  Component Value Date   NA 142 04/02/2023   K 5.5 (H) 04/02/2023   CL 101 04/02/2023   CO2 21 04/02/2023   BUN 25 (H) 04/02/2023   CREATININE 0.87 04/02/2023   Diabetic Labs (most recent): Lab Results  Component Value Date   HGBA1C 6.1 08/13/2023   HGBA1C 6.1 04/10/2023   HGBA1C 6.7 10/11/2022   MICROALBUR 0.5 07/18/2019   MICROALBUR 1.5 02/12/2016   Lipid Panel     Component Value Date/Time   CHOL 120 04/02/2023 0816   TRIG 97 04/02/2023 0816   HDL 32 (L) 04/02/2023 0816   CHOLHDL 3.8 04/02/2023 0816   CHOLHDL 5.5 (H) 07/18/2019 0954   VLDL 10 02/12/2016 0834   LDLCALC 69 04/02/2023 0816   LDLCALC  107 (H) 07/18/2019 0954     Assessment & Plan:   1. Uncontrolled type 2 diabetes mellitus with complication, without long-term current use of insulin  [facility])  Caulin presents with continued improvement in her glycemic profile to target.  His point-of-care A1c is 6.1%, overall improving.  He presents with significant weight loss benefiting from lifestyle medicine nutrition as well as Jardiance .    -  he remains at a high risk for more acute and chronic complications of diabetes which include CAD, CVA, CKD, retinopathy, and neuropathy. These are all discussed in detail with the patient.   Recent labs reviewed.   - I have re-counseled the patient on diet management and weight loss  by adopting a carbohydrate restricted / protein rich  Diet.  - he acknowledges that there is a room for improvement in his food and drink choices. - Suggestion is made for him to avoid simple carbohydrates  from his diet including Cakes, Sweet Desserts, Ice Cream, Soda (diet and regular), Sweet Tea, Candies, Chips,  Cookies, Store Bought Juices, Alcohol , Artificial Sweeteners,  Coffee Creamer, and Sugar-free Products, Lemonade. This will help patient to have more stable blood glucose profile and potentially avoid unintended weight gain.  The following Lifestyle Medicine recommendations according to American College of Lifestyle Medicine  [facility]) were discussed and and offered to patient and he  agrees to start the journey:  A. Whole Foods, Plant-Based Nutrition comprising of fruits and vegetables, plant-based proteins, whole-grain carbohydrates was discussed in detail with the patient.   A list for source of those nutrients were also provided to the patient.  Patient will use only water or unsweetened tea for hydration. B.  The need to stay away from risky substances including alcohol, smoking; obtaining 7 to 9 hours of restorative sleep, at least 150 minutes of moderate intensity exercise weekly, the importance of healthy social connections,  and stress management techniques were discussed. C.  A full color page of  Calorie density of various food groups per pound showing examples of each food groups was provided to the patient.    - Patient is advised to stick to a routine mealtimes to eat 3 meals  a day and avoid unnecessary snacks ( to snack only to correct hypoglycemia).  -He is responding to lifestyle medicine nutrition, presents with continued significant weight loss.   - At this time, he is advised to discontinue his Tresiba .   - Will continue to monitor blood glucose twice a day-before breakfast and at bedtime.   -He is encouraged to call clinic for blood glucose readings less than 70 or greater than 150 mg per DL at fasting. -He continues to benefit from Jardiance , advised to continue Jardiance  25 mg p.o. daily at breakfast.   - He is also on Janumet  50/1000 mg p.o. twice daily.  He will be considered for GLP-1 receptor agonist and metformin  after he finishes his current supply of Janumet .     -  Patient specific target  for A1c; LDL, HDL, Triglycerides,  were discussed in detail.  2) BP/HTN:  His blood pressure is controlled to target. - He is advised to continue lisinopril  30 mg p.o. daily at  breakfast.  He is advised on salt restrictions.     3) Lipids/HPL: His recent lipid panel showed improved LDL to 69 from 147.  He is advised to continue simvastatin  40 mg p.o. daily at bedtime  The above discussed lifestyle medicine interventions will help with dyslipidemia.  4)  Weight/Diet:  His BMI is 31.33 dropping from 36.16-- He is a candidate for some more  weight loss.    CDE consult in progress, exercise, and carbohydrates information provided.  5) Chronic Care/Health Maintenance:  -Patient is on ACEI/ARB and Statin medications and encouraged to continue to follow up with Ophthalmology, Podiatrist at least yearly or according to recommendations, and advised to  stay away from smoking. I have recommended yearly flu vaccine and pneumonia vaccination at least every 5 years; moderate intensity exercise for up to 150 minutes weekly; and  sleep for at least 7 hours a day.   His recent ABI was negative for PAD on April 28, 2020.  The study will be repeated in March 2027, or sooner if needed.  I advised patient to maintain close follow up with his PCP for primary care needs.   I spent  26  minutes in the care of the patient today including review of labs from CMP, Lipids, Thyroid Function, Hematology (current and previous including abstractions from other facilities); face-to-face time discussing  his blood glucose readings/logs, discussing hypoglycemia and hyperglycemia episodes and symptoms, medications doses, his options of short and long term treatment based on the latest standards of care / guidelines;  discussion about incorporating lifestyle medicine;  and documenting the encounter. Risk reduction counseling performed per USPSTF guidelines to reduce  obesity and cardiovascular risk  factors.     Please refer to Patient Instructions for Blood Glucose Monitoring and Insulin /Medications Dosing Guide  in media tab for additional information. Please  also refer to  Patient Self Inventory in the Media  tab for reviewed elements of pertinent patient history.  Preston Mack participated in the discussions, expressed understanding, and voiced agreement with the above plans.  All questions were answered to his satisfaction. he is encouraged to contact clinic should he have any questions or concerns prior to his return visit.   Follow up plan: Return in about 4 months (around 12/14/2023) for F/U with Pre-visit Labs, Meter/CGM/Logs, A1c here.  Ranny Earl, MD Phone: [phone]  Fax: [phone]  This note was partially dictated with voice recognition software. Similar sounding words can be transcribed inadequately or may not  be corrected upon review.  08/13/2023, 8:47 AM

## 2023-09-10 ENCOUNTER — Other Ambulatory Visit: Payer: Self-pay | Admitting: "Endocrinology

## 2023-09-10 DIAGNOSIS — E119 Type 2 diabetes mellitus without complications: Secondary | ICD-10-CM

## 2023-09-26 DIAGNOSIS — M25512 Pain in left shoulder: Secondary | ICD-10-CM | POA: Diagnosis not present

## 2023-10-22 ENCOUNTER — Other Ambulatory Visit: Payer: Self-pay | Admitting: "Endocrinology

## 2023-10-22 ENCOUNTER — Telehealth: Payer: Self-pay | Admitting: "Endocrinology

## 2023-10-22 MED ORDER — METFORMIN HCL ER (MOD) 1000 MG PO TB24: 1000.0000 mg | ORAL_TABLET | Freq: Every day | ORAL | 1 refills | Status: AC

## 2023-10-22 MED ORDER — OZEMPIC (0.25 OR 0.5 MG/DOSE) 2 MG/3ML ~~LOC~~ SOPN: 0.2500 mg | PEN_INJECTOR | SUBCUTANEOUS | 0 refills | Status: AC

## 2023-10-22 NOTE — Telephone Encounter (Signed)
 Pt presented in office with meter stating readings were high and just got off of prednisone

## 2023-10-22 NOTE — Telephone Encounter (Signed)
 Dr.Nida evaluated pt's blood glucose results. Pt is taking Janumet  50-1000mg  1 tablet twice daily and Jardiance  25mg  daily. Advised pt to continue same medication regimen per Dr.Nida's orders. Pt states he takes his last Janumet  tonight and at his last visit it was discussed once her runs out of Janumet  possibly starting Ozempic  or Mounjaro. Please advise.

## 2023-10-22 NOTE — Telephone Encounter (Signed)
 Do you want him to refill his Janumet  or switch him to Ozempic  or Mounjaro?

## 2023-10-23 NOTE — Telephone Encounter (Signed)
 Spoke with pt advising him if he is ready Dr.Nida will start him on Ozempic  and he also needs to continue taking the Metformin  part of Janumet  per Dr.Nida's orders. Pt voiced understanding. Rx's for both Ozempic  and Metformin  sent to [facility] per Dr.Nida.

## 2023-11-01 DIAGNOSIS — M25511 Pain in right shoulder: Secondary | ICD-10-CM | POA: Diagnosis not present

## 2023-11-05 DIAGNOSIS — D125 Benign neoplasm of sigmoid colon: Secondary | ICD-10-CM | POA: Diagnosis not present

## 2023-11-05 DIAGNOSIS — Z1211 Encounter for screening for malignant neoplasm of colon: Secondary | ICD-10-CM | POA: Diagnosis not present

## 2023-11-05 DIAGNOSIS — D124 Benign neoplasm of descending colon: Secondary | ICD-10-CM | POA: Diagnosis not present

## 2023-12-18 ENCOUNTER — Other Ambulatory Visit: Payer: Self-pay | Admitting: "Endocrinology

## 2023-12-20 LAB — COMPREHENSIVE METABOLIC PANEL WITH GFR
ALT: 15 IU/L (ref 0–44)
AST: 18 IU/L (ref 0–40)
Albumin: 4.5 g/dL (ref 3.8–4.9)
Alkaline Phosphatase: 41 IU/L — ABNORMAL LOW (ref 47–123)
BUN/Creatinine Ratio: 24 — ABNORMAL HIGH (ref 9–20)
BUN: 20 mg/dL (ref 6–24)
Bilirubin Total: 0.6 mg/dL (ref 0.0–1.2)
CO2: 23 mmol/L (ref 20–29)
Calcium: 9.5 mg/dL (ref 8.7–10.2)
Chloride: 105 mmol/L (ref 96–106)
Creatinine, Ser: 0.82 mg/dL (ref 0.76–1.27)
Globulin, Total: 1.8 g/dL (ref 1.5–4.5)
Glucose: 140 mg/dL — ABNORMAL HIGH (ref 70–99)
Potassium: 4.9 mmol/L (ref 3.5–5.2)
Sodium: 143 mmol/L (ref 134–144)
Total Protein: 6.3 g/dL (ref 6.0–8.5)
eGFR: 103 mL/min/1.73 (ref >59)

## 2023-12-20 LAB — LIPID PANEL
Chol/HDL Ratio: 3.9 ratio (ref 0.0–5.0)
Cholesterol, Total: 161 mg/dL (ref 100–199)
HDL: 41 mg/dL (ref >39)
LDL Chol Calc (NIH): 107 mg/dL — ABNORMAL HIGH (ref 0–99)
Triglycerides: 67 mg/dL (ref 0–149)
VLDL Cholesterol Cal: 13 mg/dL (ref 5–40)

## 2023-12-20 LAB — FIB-4 W/REFLEX TO ELF
FIB-4 Index: 1.2 (ref 0.00–2.67)
Platelets: 217 x10E3/uL (ref 150–450)

## 2023-12-20 LAB — VITAMIN D 25 HYDROXY (VIT D DEFICIENCY, FRACTURES): Vit D, 25-Hydroxy: 28.6 ng/mL — ABNORMAL LOW (ref 30.0–100.0)

## 2023-12-20 LAB — BRAIN NATRIURETIC PEPTIDE: BNP: 21.3 pg/mL (ref 0.0–100.0)

## 2023-12-24 ENCOUNTER — Encounter: Payer: Self-pay | Admitting: "Endocrinology

## 2023-12-24 ENCOUNTER — Ambulatory Visit: Admitting: "Endocrinology

## 2023-12-24 VITALS — BP 110/72 | HR 68 | Ht 74.0 in | Wt 229.8 lb

## 2023-12-24 DIAGNOSIS — E119 Type 2 diabetes mellitus without complications: Secondary | ICD-10-CM

## 2023-12-24 DIAGNOSIS — Z794 Long term (current) use of insulin: Secondary | ICD-10-CM | POA: Diagnosis not present

## 2023-12-24 DIAGNOSIS — I10 Essential (primary) hypertension: Secondary | ICD-10-CM | POA: Diagnosis not present

## 2023-12-24 DIAGNOSIS — E782 Mixed hyperlipidemia: Secondary | ICD-10-CM

## 2023-12-24 LAB — POCT GLYCOSYLATED HEMOGLOBIN (HGB A1C)

## 2023-12-24 NOTE — Progress Notes (Signed)
 12/24/2023  Endocrinology follow-up note   Subjective:    Patient ID: Preston Mack, male    DOB: December 16, 1965,    Past Medical History:  Diagnosis Date   Diabetes mellitus, type II (HCC)    Hyperlipidemia    Sleep apnea    no cpap in ten years   Traumatic rotator cuff tear, right, initial encounter 07/24/2018   Past Surgical History:  Procedure Laterality Date   BICEPT TENODESIS Right 10/02/2018   Procedure: BICEPS TENODESIS;  Surgeon: Jane Charleston, MD;  Location: [facility];  Service: Orthopedics;  Laterality: Right;   Lipoma removal     Osteophyte of bone     SHOULDER ARTHROSCOPY WITH SUBACROMIAL DECOMPRESSION, ROTATOR CUFF REPAIR AND BICEP TENDON REPAIR Right 10/02/2018   Procedure: RIGHT SHOULDER ARTHROSCOPY DEBRIDEMENT,  WITH SUBACROMIAL DECOMPRESSION,  DISTAL CLAVICAL EXCISION, ROTATOR CUFF REPAIR AND  BICEP TENODESIS;  Surgeon: Jane Charleston, MD;  Location: [facility];  Service: Orthopedics;  Laterality: Right;  PRE/POST OP SCALENE   TOTAL HIP ARTHROPLASTY Left 10/2021   Social History   Socioeconomic History   Marital status: Married    Spouse name: Not on file   Number of children: Not on file   Years of education: Not on file   Highest education level: Not on file  Occupational History   Not on file  Tobacco Use   Smoking status: Former   Smokeless tobacco: Never  Vaping Use   Vaping status: Never Used  Substance and Sexual Activity   Alcohol use: Yes    Alcohol/week: 0.0 standard drinks of alcohol    Comment: occas   Drug use: No   Sexual activity: Not on file  Other Topics Concern   Not on file  Social History Narrative   Not on file   Social Drivers of Health   Financial Resource Strain: Not on file  Food Insecurity: Not on file  Transportation Needs: Not on file  Physical Activity: Not on file  Stress: Not on file  Social Connections: Unknown (06/21/2021)   Received from [facility]   Social Network     Social Network: Not on file   Outpatient Encounter Medications as of 12/24/2023  Medication Sig   B-D ULTRAFINE III SHORT PEN 31G X 8 MM MISC AS DIRECTED   escitalopram (LEXAPRO) 10 MG tablet Take 10 mg by mouth daily. (Patient not taking: Reported on 11/28/2021)   JARDIANCE  25 MG TABS tablet TAKE ONE TABLET BY MOUTH EVERY MORNING WITH BREAKFAST   lisinopril  (ZESTRIL ) 30 MG tablet Take 1 tablet (30 mg total) by mouth daily.   metFORMIN  (GLUMETZA ) 1000 MG (MOD) 24 hr tablet Take 1 tablet (1,000 mg total) by mouth daily after breakfast.   Semaglutide ,0.25 or 0.5MG /DOS, (OZEMPIC , 0.25 OR 0.5 MG/DOSE,) 2 MG/3ML SOPN Inject 0.25 mg into the skin once a week.   simvastatin  (ZOCOR ) 40 MG tablet Take 1 tablet (40 mg total) by mouth every evening.   TRUE METRIX BLOOD GLUCOSE TEST test strip check blood sugar TWICE DAILY   No facility-administered encounter medications on file as of 12/24/2023.   ALLERGIES: No Known Allergies VACCINATION STATUS:  There is no immunization history on file for this patient.  Diabetes He presents for his follow-up diabetic visit. He has type 2 diabetes mellitus. Onset time: He was diagnosed at approximate age of 37 years. His disease course has been worsening. There are no hypoglycemic associated symptoms. Pertinent negatives for hypoglycemia include no confusion, headaches, pallor or seizures.  Pertinent negatives for diabetes include no chest pain, no fatigue, no polydipsia, no polyphagia, no polyuria and no weakness. There are no hypoglycemic complications. Symptoms are worsening. There are no diabetic complications. Risk factors for coronary artery disease include diabetes mellitus, dyslipidemia, hypertension, family history, obesity, sedentary lifestyle, tobacco exposure and male sex. Current diabetic treatment includes oral agent (dual therapy). He is compliant with treatment most of the time. His weight is decreasing steadily. When asked about meal planning, he  reported none. He has had a previous visit with a dietitian. He participates in exercise intermittently. His home blood glucose trend is increasing steadily. His breakfast blood glucose range is generally 130-140 mg/dl. His bedtime blood glucose range is generally 130-140 mg/dl. His overall blood glucose range is 130-140 mg/dl. Roza presents with increased average blood glucose of 142 for the last 30 days, point-of-care A1c of 7.2% increasing from 6.1% during his last visit.  He presents with significant weight loss, tolerating low-dose Mounjaro along with Jardiance  and metformin .      ) An ACE inhibitor/angiotensin II receptor blocker is being taken. Eye exam is current.  Hyperlipidemia This is a chronic problem. The current episode started more than 1 year ago. The problem is controlled. Exacerbating diseases include diabetes and obesity. Pertinent negatives include no chest pain, myalgias or shortness of breath. Current antihyperlipidemic treatment includes statins. Risk factors for coronary artery disease include diabetes mellitus, dyslipidemia, hypertension, male sex, a sedentary lifestyle and obesity.  Hypertension This is a chronic problem. The current episode started more than 1 year ago. The problem is controlled. Pertinent negatives include no chest pain, headaches, neck pain, palpitations or shortness of breath. Risk factors for coronary artery disease include dyslipidemia, diabetes mellitus, obesity, sedentary lifestyle and male gender. Past treatments include ACE inhibitors.     Objective:    BP 110/72   Pulse 68   Ht 6' 2 (1.88 m)   Wt 229 lb 12.8 oz (104.2 kg)   BMI 29.50 kg/m   Wt Readings from Last 3 Encounters:  12/24/23 229 lb 12.8 oz (104.2 kg)  08/13/23 244 lb (110.7 kg)  04/10/23 254 lb 9.6 oz (115.5 kg)      Results for orders placed or performed in visit on 08/13/23  HgB A1c   Collection Time: 08/13/23  8:36 AM  Result Value Ref Range   Hemoglobin A1C      HbA1c POC (<> result, manual entry)     HbA1c, POC (prediabetic range)     HbA1c, POC (controlled diabetic range) 6.1 0.0 - 7.0 %  Comprehensive metabolic panel with GFR   Collection Time: 12/19/23  8:05 AM  Result Value Ref Range   Glucose 140 (H) 70 - 99 mg/dL   BUN 20 6 - 24 mg/dL   Creatinine, Ser 9.17 0.76 - 1.27 mg/dL   eGFR 896 >40 fO/fpw/8.26   BUN/Creatinine Ratio 24 (H) 9 - 20   Sodium 143 134 - 144 mmol/L   Potassium 4.9 3.5 - 5.2 mmol/L   Chloride 105 96 - 106 mmol/L   CO2 23 20 - 29 mmol/L   Calcium 9.5 8.7 - 10.2 mg/dL   Total Protein 6.3 6.0 - 8.5 g/dL   Albumin 4.5 3.8 - 4.9 g/dL   Globulin, Total 1.8 1.5 - 4.5 g/dL   Bilirubin Total 0.6 0.0 - 1.2 mg/dL   Alkaline Phosphatase 41 (L) 47 - 123 IU/L   AST 18 0 - 40 IU/L   ALT 15 0 - 44 IU/L  Lipid panel   Collection Time: 12/19/23  8:05 AM  Result Value Ref Range   Cholesterol, Total 161 100 - 199 mg/dL   Triglycerides 67 0 - 149 mg/dL   HDL 41 >60 mg/dL   VLDL Cholesterol Cal 13 5 - 40 mg/dL   LDL Chol Calc (NIH) 892 (H) 0 - 99 mg/dL   Chol/HDL Ratio 3.9 0.0 - 5.0 ratio  FIB-4 W/REFLEX TO ELF   Collection Time: 12/19/23  8:05 AM  Result Value Ref Range   Platelets 217 150 - 450 x10E3/uL   FIB-4 Index 1.20 0.00 - 2.67  Brain natriuretic peptide   Collection Time: 12/19/23  8:05 AM  Result Value Ref Range   BNP 21.3 0.0 - 100.0 pg/mL  VITAMIN D 25 Hydroxy (Vit-D Deficiency, Fractures)   Collection Time: 12/19/23  8:05 AM  Result Value Ref Range   Vit D, 25-Hydroxy 28.6 (L) 30.0 - 100.0 ng/mL   Complete Blood Count (Most recent): Lab Results  Component Value Date   WBC 5.9 11/18/2021   HGB 14.1 11/18/2021   HCT 42.4 11/18/2021   MCV 91 11/18/2021   PLT 217 12/19/2023   Chemistry (most recent): Lab Results  Component Value Date   NA 143 12/19/2023   K 4.9 12/19/2023   CL 105 12/19/2023   CO2 23 12/19/2023   BUN 20 12/19/2023   CREATININE 0.82 12/19/2023   Diabetic Labs (most recent): Lab  Results  Component Value Date   HGBA1C 6.1 08/13/2023   HGBA1C 6.1 04/10/2023   HGBA1C 6.7 10/11/2022   MICROALBUR 0.5 07/18/2019   MICROALBUR 1.5 02/12/2016   Lipid Panel     Component Value Date/Time   CHOL 161 12/19/2023 0805   TRIG 67 12/19/2023 0805   HDL 41 12/19/2023 0805   CHOLHDL 3.9 12/19/2023 0805   CHOLHDL 5.5 (H) 07/18/2019 0954   VLDL 10 02/12/2016 0834   LDLCALC 107 (H) 12/19/2023 0805   LDLCALC 107 (H) 07/18/2019 0954     Assessment & Plan:   1. Uncontrolled type 2 diabetes mellitus with complication, without long-term current use of insulin  (HCC)  Offie presents with increased average blood glucose of 142 for the last 30 days, point-of-care A1c of 7.2% increasing from 6.1% during his last visit.  He presents with significant weight loss, tolerating low-dose Mounjaro along with Jardiance  and metformin .    -  he remains at a high risk for more acute and chronic complications of diabetes which include CAD, CVA, CKD, retinopathy, and neuropathy. These are all discussed in detail with the patient.   Recent labs reviewed.   - I have re-counseled the patient on diet management and weight loss  by adopting a carbohydrate restricted / protein rich  Diet.  - he acknowledges that there is a room for improvement in his food and drink choices. - Suggestion is made for him to avoid simple carbohydrates  from his diet including Cakes, Sweet Desserts, Ice Cream, Soda (diet and regular), Sweet Tea, Candies, Chips, Cookies, Store Bought Juices, Alcohol , Artificial Sweeteners,  Coffee Creamer, and Sugar-free Products, Lemonade. This will help patient to have more stable blood glucose profile and potentially avoid unintended weight gain. The following Lifestyle Medicine recommendations according to American College of Lifestyle Medicine  [facility]) were discussed and and offered to patient and he  agrees to start the journey:  A. Whole Foods, Plant-Based Nutrition comprising of  fruits and vegetables, plant-based proteins, whole-grain carbohydrates was discussed in detail with the patient.  A list for source of those nutrients were also provided to the patient.  Patient will use only water or unsweetened tea for hydration. B.  The need to stay away from risky substances including alcohol, smoking; obtaining 7 to 9 hours of restorative sleep, at least 150 minutes of moderate intensity exercise weekly, the importance of healthy social connections,  and stress management techniques were discussed. C.  A full color page of  Calorie density of various food groups per pound showing examples of each food groups was provided to the patient.   - Patient is advised to stick to a routine mealtimes to eat 3 meals  a day and avoid unnecessary snacks ( to snack only to correct hypoglycemia).  He has tolerated low-dose Mounjaro, presents with significant weight loss would like to keep his current dose. -Was recently taken off of insulin . - Will continue to monitor blood glucose twice a day-before breakfast and at bedtime.   -He is encouraged to call clinic for blood glucose readings less than 70 or greater than 200 mg per DL at fasting. -He continues to benefit from Jardiance , advised to continue Jardiance  25 mg p.o. daily at breakfast.   - He is advised to continue metformin  1000 mg p.o. once a day at breakfast, advised to continue Mounjaro 2.5 mg subcutaneously weekly.  Side effects and precaution discussed with him.    - Patient specific target  for A1c; LDL, HDL, Triglycerides,  were discussed in detail.  2) BP/HTN:  -His blood pressure is controlled to target. - He is advised to continue lisinopril  30 mg p.o. daily at  breakfast.  He is advised on salt restrictions.     3) Lipids/HPL: His recent lipid panel showed improved LDL to 69 from 147.  He is advised to continue simvastatin  40 mg p.o. daily at bedtime.   The above discussed lifestyle medicine interventions will help  with dyslipidemia.  4)  Weight/Diet: His BMI is 29.50 kg/m overall dropping from 36.16 kg/m.   He is responding to Mounjaro.  He is a candidate for some more  weight loss.    CDE consult in progress, exercise, and carbohydrates information provided.  5) Chronic Care/Health Maintenance:  -Patient is on ACEI/ARB and Statin medications and encouraged to continue to follow up with Ophthalmology, Podiatrist at least yearly or according to recommendations, and advised to  stay away from smoking. I have recommended yearly flu vaccine and pneumonia vaccination at least every 5 years; moderate intensity exercise for up to 150 minutes weekly; and  sleep for at least 7 hours a day.   His recent ABI was negative for PAD on April 28, 2020.  The study will be repeated in March 2027, or sooner if needed.  I advised patient to maintain close follow up with his PCP for primary care needs.   I spent  26  minutes in the care of the patient today including review of labs from CMP, Lipids, Thyroid  Function, Hematology (current and previous including abstractions from other facilities); face-to-face time discussing  his blood glucose readings/logs, discussing hypoglycemia and hyperglycemia episodes and symptoms, medications doses, his options of short and long term treatment based on the latest standards of care / guidelines;  discussion about incorporating lifestyle medicine;  and documenting the encounter. Risk reduction counseling performed per USPSTF guidelines to reduce  obesity and cardiovascular risk factors.     Please refer to Patient Instructions for Blood Glucose Monitoring and Insulin /Medications Dosing Guide  in media tab for  additional information. Please  also refer to  Patient Self Inventory in the Media  tab for reviewed elements of pertinent patient history.  Preston Mack participated in the discussions, expressed understanding, and voiced agreement with the above plans.  All questions were  answered to his satisfaction. he is encouraged to contact clinic should he have any questions or concerns prior to his return visit.   Follow up plan: Return in about 4 months (around 04/22/2024) for Bring Meter/CGM Device/Logs- A1c in Office.  Ranny Earl, MD Phone: [phone]  Fax: [phone]  This note was partially dictated with voice recognition software. Similar sounding words can be transcribed inadequately or may not  be corrected upon review.  12/24/2023, 8:55 AM

## 2023-12-24 NOTE — Patient Instructions (Signed)

## 2024-04-23 ENCOUNTER — Ambulatory Visit: Admitting: "Endocrinology
